# Patient Record
Sex: Female | Born: 1965 | Race: Black or African American | Hispanic: No | Smoking: Former smoker
Health system: Southern US, Community
[De-identification: ages and names within clinical notes are randomized; demographics above are authoritative.]

## PROBLEM LIST (undated history)

## (undated) DIAGNOSIS — N938 Other specified abnormal uterine and vaginal bleeding: Secondary | ICD-10-CM

## (undated) DIAGNOSIS — T7840XA Allergy, unspecified, initial encounter: Secondary | ICD-10-CM

## (undated) DIAGNOSIS — G43909 Migraine, unspecified, not intractable, without status migrainosus: Secondary | ICD-10-CM

## (undated) DIAGNOSIS — E119 Type 2 diabetes mellitus without complications: Secondary | ICD-10-CM

## (undated) DIAGNOSIS — D649 Anemia, unspecified: Secondary | ICD-10-CM

## (undated) DIAGNOSIS — I10 Essential (primary) hypertension: Secondary | ICD-10-CM

## (undated) HISTORY — DX: Allergy, unspecified, initial encounter: T78.40XA

## (undated) HISTORY — DX: Anemia, unspecified: D64.9

## (undated) HISTORY — DX: Other specified abnormal uterine and vaginal bleeding: N93.8

## (undated) HISTORY — DX: Essential (primary) hypertension: I10

## (undated) HISTORY — DX: Migraine, unspecified, not intractable, without status migrainosus: G43.909

---

## 1997-11-22 ENCOUNTER — Emergency Department (HOSPITAL_COMMUNITY): Admission: EM | Admit: 1997-11-22 | Discharge: 1997-11-22 | Payer: Self-pay | Admitting: Emergency Medicine

## 1997-11-24 ENCOUNTER — Encounter: Admission: RE | Admit: 1997-11-24 | Discharge: 1997-11-24 | Payer: Self-pay | Admitting: Family Medicine

## 1998-02-05 ENCOUNTER — Encounter: Admission: RE | Admit: 1998-02-05 | Discharge: 1998-02-05 | Payer: Self-pay | Admitting: Family Medicine

## 1998-02-11 ENCOUNTER — Encounter: Admission: RE | Admit: 1998-02-11 | Discharge: 1998-05-12 | Payer: Self-pay | Admitting: *Deleted

## 1998-02-24 ENCOUNTER — Encounter: Admission: RE | Admit: 1998-02-24 | Discharge: 1998-05-25 | Payer: Self-pay

## 1998-03-20 ENCOUNTER — Ambulatory Visit (HOSPITAL_COMMUNITY): Admission: RE | Admit: 1998-03-20 | Discharge: 1998-03-20 | Payer: Self-pay | Admitting: Orthopedic Surgery

## 1998-03-24 ENCOUNTER — Ambulatory Visit (HOSPITAL_COMMUNITY): Admission: RE | Admit: 1998-03-24 | Discharge: 1998-03-24 | Payer: Self-pay | Admitting: Orthopedic Surgery

## 1998-04-06 ENCOUNTER — Encounter: Admission: RE | Admit: 1998-04-06 | Discharge: 1998-07-05 | Payer: Self-pay | Admitting: Anesthesiology

## 1998-11-01 ENCOUNTER — Encounter: Admission: RE | Admit: 1998-11-01 | Discharge: 1998-11-01 | Payer: Self-pay | Admitting: Family Medicine

## 1998-11-09 ENCOUNTER — Emergency Department (HOSPITAL_COMMUNITY): Admission: EM | Admit: 1998-11-09 | Discharge: 1998-11-09 | Payer: Self-pay | Admitting: Emergency Medicine

## 1998-11-10 ENCOUNTER — Other Ambulatory Visit: Admission: RE | Admit: 1998-11-10 | Discharge: 1998-11-10 | Payer: Self-pay | Admitting: Family Medicine

## 1998-11-10 ENCOUNTER — Encounter: Admission: RE | Admit: 1998-11-10 | Discharge: 1998-11-10 | Payer: Self-pay | Admitting: Family Medicine

## 1998-11-16 ENCOUNTER — Encounter: Admission: RE | Admit: 1998-11-16 | Discharge: 1998-12-08 | Payer: Self-pay

## 1998-11-19 ENCOUNTER — Encounter: Payer: Self-pay | Admitting: Emergency Medicine

## 1998-11-19 ENCOUNTER — Emergency Department (HOSPITAL_COMMUNITY): Admission: EM | Admit: 1998-11-19 | Discharge: 1998-11-19 | Payer: Self-pay | Admitting: Emergency Medicine

## 1998-12-01 ENCOUNTER — Encounter: Admission: RE | Admit: 1998-12-01 | Discharge: 1998-12-01 | Payer: Self-pay | Admitting: Family Medicine

## 1998-12-09 ENCOUNTER — Encounter: Admission: RE | Admit: 1998-12-09 | Discharge: 1998-12-09 | Payer: Self-pay | Admitting: Family Medicine

## 1998-12-21 ENCOUNTER — Encounter: Admission: RE | Admit: 1998-12-21 | Discharge: 1998-12-21 | Payer: Self-pay | Admitting: Family Medicine

## 1999-06-10 ENCOUNTER — Encounter: Admission: RE | Admit: 1999-06-10 | Discharge: 1999-06-10 | Payer: Self-pay | Admitting: Sports Medicine

## 1999-07-12 ENCOUNTER — Encounter: Admission: RE | Admit: 1999-07-12 | Discharge: 1999-07-12 | Payer: Self-pay | Admitting: Family Medicine

## 1999-12-02 ENCOUNTER — Encounter: Admission: RE | Admit: 1999-12-02 | Discharge: 1999-12-02 | Payer: Self-pay | Admitting: Family Medicine

## 1999-12-06 ENCOUNTER — Encounter: Admission: RE | Admit: 1999-12-06 | Discharge: 1999-12-06 | Payer: Self-pay | Admitting: Family Medicine

## 1999-12-16 ENCOUNTER — Encounter: Admission: RE | Admit: 1999-12-16 | Discharge: 1999-12-16 | Payer: Self-pay | Admitting: Sports Medicine

## 1999-12-16 ENCOUNTER — Encounter: Payer: Self-pay | Admitting: Sports Medicine

## 1999-12-30 ENCOUNTER — Encounter: Admission: RE | Admit: 1999-12-30 | Discharge: 1999-12-30 | Payer: Self-pay | Admitting: Family Medicine

## 2000-03-20 ENCOUNTER — Encounter: Admission: RE | Admit: 2000-03-20 | Discharge: 2000-03-20 | Payer: Self-pay | Admitting: Family Medicine

## 2000-03-20 ENCOUNTER — Encounter: Payer: Self-pay | Admitting: Family Medicine

## 2000-07-31 ENCOUNTER — Encounter: Admission: RE | Admit: 2000-07-31 | Discharge: 2000-07-31 | Payer: Self-pay | Admitting: Sports Medicine

## 2000-09-04 ENCOUNTER — Encounter: Admission: RE | Admit: 2000-09-04 | Discharge: 2000-09-04 | Payer: Self-pay | Admitting: Family Medicine

## 2000-09-07 ENCOUNTER — Encounter: Admission: RE | Admit: 2000-09-07 | Discharge: 2000-09-07 | Payer: Self-pay | Admitting: Family Medicine

## 2001-04-19 ENCOUNTER — Encounter: Admission: RE | Admit: 2001-04-19 | Discharge: 2001-04-19 | Payer: Self-pay | Admitting: Family Medicine

## 2001-06-07 ENCOUNTER — Encounter: Admission: RE | Admit: 2001-06-07 | Discharge: 2001-06-07 | Payer: Self-pay | Admitting: Family Medicine

## 2001-06-14 ENCOUNTER — Encounter: Admission: RE | Admit: 2001-06-14 | Discharge: 2001-06-14 | Payer: Self-pay | Admitting: Family Medicine

## 2001-07-30 ENCOUNTER — Encounter: Admission: RE | Admit: 2001-07-30 | Discharge: 2001-07-30 | Payer: Self-pay | Admitting: Occupational Medicine

## 2001-07-30 ENCOUNTER — Encounter: Payer: Self-pay | Admitting: Occupational Medicine

## 2001-11-08 ENCOUNTER — Encounter: Admission: RE | Admit: 2001-11-08 | Discharge: 2001-11-08 | Payer: Self-pay | Admitting: Family Medicine

## 2002-11-27 ENCOUNTER — Encounter: Admission: RE | Admit: 2002-11-27 | Discharge: 2002-11-27 | Payer: Self-pay | Admitting: Sports Medicine

## 2002-11-28 ENCOUNTER — Encounter: Admission: RE | Admit: 2002-11-28 | Discharge: 2002-11-28 | Payer: Self-pay | Admitting: Sports Medicine

## 2002-11-28 ENCOUNTER — Encounter: Payer: Self-pay | Admitting: Sports Medicine

## 2002-12-04 ENCOUNTER — Encounter: Admission: RE | Admit: 2002-12-04 | Discharge: 2002-12-04 | Payer: Self-pay | Admitting: Family Medicine

## 2004-02-08 ENCOUNTER — Encounter: Admission: RE | Admit: 2004-02-08 | Discharge: 2004-02-08 | Payer: Self-pay | Admitting: Family Medicine

## 2004-03-01 ENCOUNTER — Encounter: Admission: RE | Admit: 2004-03-01 | Discharge: 2004-03-01 | Payer: Self-pay | Admitting: Family Medicine

## 2004-04-05 ENCOUNTER — Encounter: Admission: RE | Admit: 2004-04-05 | Discharge: 2004-04-05 | Payer: Self-pay | Admitting: Family Medicine

## 2004-08-12 ENCOUNTER — Ambulatory Visit: Payer: Self-pay | Admitting: Family Medicine

## 2005-04-12 ENCOUNTER — Ambulatory Visit: Payer: Self-pay | Admitting: Family Medicine

## 2005-04-14 ENCOUNTER — Encounter (INDEPENDENT_AMBULATORY_CARE_PROVIDER_SITE_OTHER): Payer: Self-pay | Admitting: *Deleted

## 2005-04-14 LAB — CONVERTED CEMR LAB

## 2005-05-05 ENCOUNTER — Ambulatory Visit: Payer: Self-pay | Admitting: Family Medicine

## 2005-06-01 ENCOUNTER — Ambulatory Visit: Payer: Self-pay | Admitting: Family Medicine

## 2005-11-17 ENCOUNTER — Encounter: Admission: RE | Admit: 2005-11-17 | Discharge: 2005-11-17 | Payer: Self-pay | Admitting: Sports Medicine

## 2006-06-19 ENCOUNTER — Ambulatory Visit (HOSPITAL_COMMUNITY): Admission: RE | Admit: 2006-06-19 | Discharge: 2006-06-19 | Payer: Self-pay | Admitting: Family Medicine

## 2006-06-19 ENCOUNTER — Ambulatory Visit: Payer: Self-pay | Admitting: Family Medicine

## 2006-10-11 DIAGNOSIS — E669 Obesity, unspecified: Secondary | ICD-10-CM

## 2006-10-11 DIAGNOSIS — I10 Essential (primary) hypertension: Secondary | ICD-10-CM

## 2006-10-12 ENCOUNTER — Encounter (INDEPENDENT_AMBULATORY_CARE_PROVIDER_SITE_OTHER): Payer: Self-pay | Admitting: *Deleted

## 2007-02-18 ENCOUNTER — Encounter (INDEPENDENT_AMBULATORY_CARE_PROVIDER_SITE_OTHER): Payer: Self-pay | Admitting: Family Medicine

## 2007-02-18 ENCOUNTER — Ambulatory Visit: Payer: Self-pay | Admitting: Sports Medicine

## 2007-02-18 LAB — CONVERTED CEMR LAB
CO2: 26 meq/L (ref 19–32)
Calcium: 9 mg/dL (ref 8.4–10.5)
Cholesterol: 161 mg/dL (ref 0–200)
HDL: 47 mg/dL (ref >39)
Sodium: 141 meq/L (ref 135–145)
Total CHOL/HDL Ratio: 3.4

## 2007-02-22 ENCOUNTER — Encounter (INDEPENDENT_AMBULATORY_CARE_PROVIDER_SITE_OTHER): Payer: Self-pay | Admitting: Family Medicine

## 2007-10-07 ENCOUNTER — Emergency Department (HOSPITAL_COMMUNITY): Admission: EM | Admit: 2007-10-07 | Discharge: 2007-10-07 | Payer: Self-pay | Admitting: Emergency Medicine

## 2007-10-09 ENCOUNTER — Ambulatory Visit: Payer: Self-pay | Admitting: Family Medicine

## 2007-10-21 ENCOUNTER — Encounter (INDEPENDENT_AMBULATORY_CARE_PROVIDER_SITE_OTHER): Payer: Self-pay | Admitting: *Deleted

## 2007-10-21 ENCOUNTER — Ambulatory Visit: Payer: Self-pay | Admitting: Family Medicine

## 2007-10-21 ENCOUNTER — Encounter (INDEPENDENT_AMBULATORY_CARE_PROVIDER_SITE_OTHER): Payer: Self-pay | Admitting: Family Medicine

## 2007-10-21 ENCOUNTER — Ambulatory Visit (HOSPITAL_COMMUNITY): Admission: RE | Admit: 2007-10-21 | Discharge: 2007-10-21 | Payer: Self-pay | Admitting: Family Medicine

## 2007-10-21 DIAGNOSIS — D509 Iron deficiency anemia, unspecified: Secondary | ICD-10-CM

## 2007-10-21 LAB — CONVERTED CEMR LAB
BUN: 9 mg/dL (ref 6–23)
Calcium: 9 mg/dL (ref 8.4–10.5)
Eosinophils Absolute: 0.1 10*3/uL (ref 0.0–0.7)
Eosinophils Relative: 1 % (ref 0–5)
Glucose, Bld: 87 mg/dL (ref 70–99)
HCT: 35.5 % — ABNORMAL LOW (ref 36.0–46.0)
Hemoglobin: 12.4 g/dL (ref 12.0–15.0)
Iron: 47 ug/dL (ref 42–145)
Lymphs Abs: 1.6 10*3/uL (ref 0.7–4.0)
MCV: 70.9 fL — ABNORMAL LOW (ref 78.0–100.0)
Monocytes Relative: 8 % (ref 3–12)
RBC: 5.01 M/uL (ref 3.87–5.11)
Total CK: 85 units/L (ref 7–177)
WBC: 4.7 10*3/uL (ref 4.0–10.5)

## 2007-10-23 ENCOUNTER — Encounter (INDEPENDENT_AMBULATORY_CARE_PROVIDER_SITE_OTHER): Payer: Self-pay | Admitting: Family Medicine

## 2007-10-24 ENCOUNTER — Encounter (INDEPENDENT_AMBULATORY_CARE_PROVIDER_SITE_OTHER): Payer: Self-pay | Admitting: Family Medicine

## 2007-10-24 ENCOUNTER — Ambulatory Visit: Payer: Self-pay | Admitting: Family Medicine

## 2007-10-24 LAB — CONVERTED CEMR LAB
Bilirubin, Direct: 0.1 mg/dL (ref 0.0–0.3)
CMV IgM: <0.9 (ref <0.90)
Heterophile Ab Screen: NEGATIVE
Hgb A: 97.3 % (ref 96.8–97.8)
Hgb S Quant: 0 % (ref 0.0–0.0)
INR: 1.1 (ref 0.0–1.5)
Indirect Bilirubin: 0.3 mg/dL (ref 0.0–0.9)
Prothrombin Time: 14.5 s (ref 11.6–15.2)
Total CK: 86 units/L (ref 7–177)
Total Protein: 6.9 g/dL (ref 6.0–8.3)

## 2007-10-28 ENCOUNTER — Encounter (HOSPITAL_COMMUNITY): Admission: RE | Admit: 2007-10-28 | Discharge: 2007-10-29 | Payer: Self-pay | Admitting: Family Medicine

## 2007-10-30 ENCOUNTER — Ambulatory Visit (HOSPITAL_COMMUNITY): Admission: RE | Admit: 2007-10-30 | Discharge: 2007-10-30 | Payer: Self-pay | Admitting: Sports Medicine

## 2007-10-30 ENCOUNTER — Encounter: Payer: Self-pay | Admitting: Sports Medicine

## 2007-10-30 ENCOUNTER — Telehealth (INDEPENDENT_AMBULATORY_CARE_PROVIDER_SITE_OTHER): Payer: Self-pay | Admitting: Family Medicine

## 2007-10-31 ENCOUNTER — Telehealth (INDEPENDENT_AMBULATORY_CARE_PROVIDER_SITE_OTHER): Payer: Self-pay | Admitting: Family Medicine

## 2007-11-07 ENCOUNTER — Ambulatory Visit: Payer: Self-pay | Admitting: Family Medicine

## 2008-04-13 ENCOUNTER — Encounter (INDEPENDENT_AMBULATORY_CARE_PROVIDER_SITE_OTHER): Payer: Self-pay | Admitting: Family Medicine

## 2008-04-13 ENCOUNTER — Ambulatory Visit: Payer: Self-pay | Admitting: Family Medicine

## 2008-04-13 ENCOUNTER — Encounter: Payer: Self-pay | Admitting: Family Medicine

## 2008-04-13 LAB — CONVERTED CEMR LAB
HCT: 31.1 % — ABNORMAL LOW (ref 36.0–46.0)
Hemoglobin: 11.3 g/dL — ABNORMAL LOW (ref 12.0–15.0)
RBC: 4.43 M/uL (ref 3.87–5.11)
RDW: 15.1 % (ref 11.5–15.5)
Saturation Ratios: 26 % (ref 20–55)

## 2008-04-17 ENCOUNTER — Encounter: Payer: Self-pay | Admitting: Family Medicine

## 2008-05-25 ENCOUNTER — Encounter: Admission: RE | Admit: 2008-05-25 | Discharge: 2008-05-25 | Payer: Self-pay | Admitting: Family Medicine

## 2008-05-28 ENCOUNTER — Emergency Department (HOSPITAL_COMMUNITY): Admission: EM | Admit: 2008-05-28 | Discharge: 2008-05-28 | Payer: Self-pay | Admitting: Family Medicine

## 2008-11-29 ENCOUNTER — Emergency Department (HOSPITAL_COMMUNITY): Admission: EM | Admit: 2008-11-29 | Discharge: 2008-11-29 | Payer: Self-pay | Admitting: Emergency Medicine

## 2008-11-30 ENCOUNTER — Ambulatory Visit (HOSPITAL_COMMUNITY): Admission: RE | Admit: 2008-11-30 | Discharge: 2008-11-30 | Payer: Self-pay | Admitting: Family Medicine

## 2008-11-30 ENCOUNTER — Encounter (INDEPENDENT_AMBULATORY_CARE_PROVIDER_SITE_OTHER): Payer: Self-pay | Admitting: Family Medicine

## 2008-11-30 ENCOUNTER — Telehealth: Payer: Self-pay | Admitting: Family Medicine

## 2008-11-30 ENCOUNTER — Ambulatory Visit: Payer: Self-pay | Admitting: Family Medicine

## 2008-11-30 DIAGNOSIS — R1013 Epigastric pain: Secondary | ICD-10-CM

## 2008-11-30 LAB — CONVERTED CEMR LAB
ALT: 15 units/L (ref 0–35)
Basophils Relative: 0 % (ref 0–1)
CO2: 27 meq/L (ref 19–32)
Calcium: 9.2 mg/dL (ref 8.4–10.5)
Chloride: 102 meq/L (ref 96–112)
Creatinine, Ser: 0.97 mg/dL (ref 0.40–1.20)
Eosinophils Absolute: 0.1 10*3/uL (ref 0.0–0.7)
Eosinophils Relative: 2 % (ref 0–5)
Glucose, Bld: 86 mg/dL (ref 70–99)
HCT: 35.7 % — ABNORMAL LOW (ref 36.0–46.0)
Hemoglobin: 12.1 g/dL (ref 12.0–15.0)
Lipase: 35 units/L (ref 11–59)
Lymphs Abs: 2.2 10*3/uL (ref 0.7–4.0)
MCHC: 33.9 g/dL (ref 30.0–36.0)
MCV: 76.6 fL — ABNORMAL LOW (ref 78.0–100.0)
Monocytes Absolute: 0.3 10*3/uL (ref 0.1–1.0)
Monocytes Relative: 7 % (ref 3–12)
Neutrophils Relative %: 48 % (ref 43–77)
RBC: 4.65 M/uL (ref 3.87–5.11)
Total Bilirubin: 0.4 mg/dL (ref 0.3–1.2)
WBC: 5.1 10*3/uL (ref 4.0–10.5)

## 2009-04-13 ENCOUNTER — Encounter: Admission: RE | Admit: 2009-04-13 | Discharge: 2009-04-13 | Payer: Self-pay | Admitting: Family Medicine

## 2009-04-13 ENCOUNTER — Encounter (INDEPENDENT_AMBULATORY_CARE_PROVIDER_SITE_OTHER): Payer: Self-pay | Admitting: Family Medicine

## 2009-04-13 ENCOUNTER — Ambulatory Visit: Payer: Self-pay | Admitting: Family Medicine

## 2009-04-13 ENCOUNTER — Encounter: Payer: Self-pay | Admitting: Family Medicine

## 2009-04-13 DIAGNOSIS — N926 Irregular menstruation, unspecified: Secondary | ICD-10-CM | POA: Insufficient documentation

## 2009-04-13 DIAGNOSIS — R29898 Other symptoms and signs involving the musculoskeletal system: Secondary | ICD-10-CM | POA: Insufficient documentation

## 2009-04-13 LAB — CONVERTED CEMR LAB
Chlamydia, DNA Probe: NEGATIVE
GC Probe Amp, Genital: NEGATIVE

## 2009-04-14 ENCOUNTER — Ambulatory Visit (HOSPITAL_COMMUNITY): Admission: RE | Admit: 2009-04-14 | Discharge: 2009-04-14 | Payer: Self-pay | Admitting: Family Medicine

## 2009-04-15 ENCOUNTER — Telehealth: Payer: Self-pay | Admitting: *Deleted

## 2009-05-11 ENCOUNTER — Telehealth: Payer: Self-pay | Admitting: Family Medicine

## 2009-10-19 ENCOUNTER — Emergency Department (HOSPITAL_COMMUNITY): Admission: EM | Admit: 2009-10-19 | Discharge: 2009-10-19 | Payer: Self-pay | Admitting: Family Medicine

## 2010-02-13 ENCOUNTER — Emergency Department (HOSPITAL_COMMUNITY): Admission: EM | Admit: 2010-02-13 | Discharge: 2010-02-13 | Payer: Self-pay | Admitting: Family Medicine

## 2010-04-28 ENCOUNTER — Telehealth: Payer: Self-pay | Admitting: Family Medicine

## 2010-06-17 ENCOUNTER — Ambulatory Visit: Payer: Self-pay | Admitting: Family Medicine

## 2010-06-17 ENCOUNTER — Encounter: Payer: Self-pay | Admitting: Family Medicine

## 2010-06-17 DIAGNOSIS — R5381 Other malaise: Secondary | ICD-10-CM | POA: Insufficient documentation

## 2010-06-17 DIAGNOSIS — IMO0002 Reserved for concepts with insufficient information to code with codable children: Secondary | ICD-10-CM | POA: Insufficient documentation

## 2010-06-17 DIAGNOSIS — R5383 Other fatigue: Secondary | ICD-10-CM

## 2010-06-17 LAB — CONVERTED CEMR LAB
HCT: 34.3 % — ABNORMAL LOW (ref 36.0–46.0)
HDL: 51 mg/dL (ref >39)
Hemoglobin: 12.3 g/dL (ref 12.0–15.0)
LDL Cholesterol: 104 mg/dL — ABNORMAL HIGH (ref 0–99)
MCHC: 35.9 g/dL (ref 30.0–36.0)
Platelets: 293 10*3/uL (ref 150–400)
RDW: 16.6 % — ABNORMAL HIGH (ref 11.5–15.5)
TSH: 2.376 microintl units/mL (ref 0.350–4.500)
Triglycerides: 58 mg/dL (ref <150)

## 2010-07-25 ENCOUNTER — Ambulatory Visit: Payer: Self-pay | Admitting: Family Medicine

## 2010-07-25 ENCOUNTER — Encounter: Payer: Self-pay | Admitting: Family Medicine

## 2010-07-25 LAB — CONVERTED CEMR LAB
Free T4: 0.78 ng/dL — ABNORMAL LOW (ref 0.80–1.80)
Iron: 60 ug/dL (ref 42–145)
MCHC: 35.8 g/dL (ref 30.0–36.0)
MCV: 69.8 fL — ABNORMAL LOW (ref 78.0–100.0)
Platelets: 262 10*3/uL (ref 150–400)
RBC: 4.64 M/uL (ref 3.87–5.11)
TIBC: 312 ug/dL (ref 250–470)
TSH: 3.224 microintl units/mL (ref 0.350–4.500)
UIBC: 252 ug/dL

## 2010-07-27 ENCOUNTER — Telehealth: Payer: Self-pay | Admitting: Family Medicine

## 2010-07-28 ENCOUNTER — Ambulatory Visit: Payer: Self-pay | Admitting: Family Medicine

## 2010-08-01 ENCOUNTER — Emergency Department (HOSPITAL_COMMUNITY)
Admission: EM | Admit: 2010-08-01 | Discharge: 2010-08-02 | Payer: Self-pay | Source: Home / Self Care | Admitting: Emergency Medicine

## 2010-08-04 ENCOUNTER — Telehealth (INDEPENDENT_AMBULATORY_CARE_PROVIDER_SITE_OTHER): Payer: Self-pay | Admitting: *Deleted

## 2010-08-04 ENCOUNTER — Ambulatory Visit: Payer: Self-pay | Admitting: Family Medicine

## 2010-08-04 ENCOUNTER — Encounter: Payer: Self-pay | Admitting: Family Medicine

## 2010-08-04 LAB — CONVERTED CEMR LAB
Pap Smear: NEGATIVE
Testosterone: 45.76 ng/dL (ref 10–70)

## 2010-08-10 ENCOUNTER — Telehealth: Payer: Self-pay | Admitting: *Deleted

## 2010-08-18 ENCOUNTER — Encounter: Payer: Self-pay | Admitting: Family Medicine

## 2010-08-18 ENCOUNTER — Ambulatory Visit: Admission: RE | Admit: 2010-08-18 | Discharge: 2010-08-18 | Payer: Self-pay | Source: Home / Self Care

## 2010-08-19 ENCOUNTER — Ambulatory Visit (HOSPITAL_COMMUNITY)
Admission: RE | Admit: 2010-08-19 | Discharge: 2010-08-19 | Payer: Self-pay | Source: Home / Self Care | Attending: Family Medicine | Admitting: Family Medicine

## 2010-08-19 ENCOUNTER — Telehealth: Payer: Self-pay | Admitting: *Deleted

## 2010-08-23 ENCOUNTER — Encounter: Payer: Self-pay | Admitting: Family Medicine

## 2010-08-23 ENCOUNTER — Encounter: Payer: Self-pay | Admitting: *Deleted

## 2010-08-23 DIAGNOSIS — N949 Unspecified condition associated with female genital organs and menstrual cycle: Secondary | ICD-10-CM | POA: Insufficient documentation

## 2010-08-23 DIAGNOSIS — D259 Leiomyoma of uterus, unspecified: Secondary | ICD-10-CM | POA: Insufficient documentation

## 2010-08-25 ENCOUNTER — Encounter: Payer: Self-pay | Admitting: *Deleted

## 2010-08-25 ENCOUNTER — Telehealth: Payer: Self-pay | Admitting: *Deleted

## 2010-08-26 ENCOUNTER — Telehealth: Payer: Self-pay | Admitting: *Deleted

## 2010-08-30 ENCOUNTER — Telehealth: Payer: Self-pay | Admitting: *Deleted

## 2010-08-31 ENCOUNTER — Telehealth: Payer: Self-pay | Admitting: Family Medicine

## 2010-09-15 NOTE — Progress Notes (Signed)
Summary: Referral  Phone Note Call from Patient Call back at [phone]   Reason for Call: Referral Summary of Call: checking status of referral for GYN Initial call taken by: Knox Royalty,  August 19, 2010 3:20 PM  Follow-up for Phone Call        told her as soon as the appt is made they will call her Follow-up by: Golden Circle RN,  August 19, 2010 3:24 PM    sent request to pcp.Loralee Pacas CMA  August 19, 2010 5:31 PM

## 2010-09-15 NOTE — Progress Notes (Signed)
Summary: medication  ---- Converted from flag ---- ---- 08/25/2010 4:25 PM, Loralee Pacas CMA wrote: Dr. Jennette Kettle  I spoke with this pt today and she told me that her periods are still heavy and because her appt with the gyn clinic is soo far away (3/1) is there anything that can be done to help her in the meantime???    ~T ------------------------------  Phone Note Outgoing Call   Call placed by: Loralee Pacas CMA,  August 31, 2010 2:59 PM Summary of Call: called pt and lvm for her to return call  Follow-up for Phone Call        called and spoke with pt and she would like for the rx to be [facility] Follow-up by: Loralee Pacas CMA,  August 31, 2010 3:22 PM     DEAR WHITE TEAM we could TRY provera 10 mg a day for ten days. I will call itin if she desires---it LOOKS like Dr Janalyn Harder tried that at end of December...so if they did and it did nothing let .Marland KitchenMarland KitchenMarland Kitchen. well let me know.  Thanks!  Denny Levy MD  August 30, 2010 4:43 PM

## 2010-09-15 NOTE — Progress Notes (Signed)
  Phone Note Call from Patient   Caller: Patient Call For: [phone] Summary of Call: Ms. Klapper is coming to the [facility] on 1/5 for Endo Bx.  Want to know what she can expect for the procedure and if she need to take anything before coming.  Call her at work at   Follow-up for Phone Call        called and explained procedure to pt she understood Follow-up by: Loralee Pacas CMA,  August 12, 2010 12:29 PM

## 2010-09-15 NOTE — Assessment & Plan Note (Signed)
Summary: f/u eo   Vital Signs:  Patient profile:   45 year old female Height:      67 inches Weight:      279.4 pounds BMI:     43.92 Temp:     98.5 degrees F oral Pulse rate:   87 / minute BP sitting:   111 / 74  (left arm) Cuff size:   large  Vitals Entered By: Garen Grams LPN (July 25, 2010 8:50 AM) CC: f/u weight Is Patient Diabetic? No Pain Assessment Patient in pain? no        Primary Care Provider:  Alvia Grove DO  CC:  f/u weight.  History of Present Illness: 45  yo female here for f/u of medical problems and to discuss weight gain (over multiple years).  My first visit with pt.  Amenorrhea:  has not had a period in over one year.  Complains of painful intercourse, started some time ago and is unchanging.  Tramadol helps.  Describes pelvic pain, worse on the left, and lasting about 3  days after intercourse.  Did have a workup for this in September 2010, ultrasound showed a 2.5  cm fibroid.  Abnormal menstral bleeding: over past year, has only had 3 menstrual cycles - once last september, then may of this year and august of this year.  1st 2 were very heavy.  in addition she has had hot flashes and vaginal dryness.  mom did go through menopause around age 92.   Menopause likely dx at this point given abscense of menstartion x 1 year.  weight:  had been going off and on to bariatric clinic, decided that she did not ultimatley want surgery, so she has abandoned the idea altogether.  feels she eats healthy in general, does exercise 5  times a week (walks). Feels she should be losing weight and is getting frustrated with her lack of improvement.   interested in medicine to aid in weight loss efforts.  had been on Adipex before and she reports good results with this medicine.  she thinks this helped curb her appetite. She had no reported side effects from this medicine.  she would like to go back on it.  she has seen nutrition a long time ago and thought this was helpful  and would consider this again.   Preventative :  would like to catch up on her preventative screens HTN:  reports good control of BP.  taking meds as prescribed.  Denies racing heart, palpitations, chest pain, SOB, DOE.    Habits & Providers  Alcohol-Tobacco-Diet     Alcohol drinks/day: 0     Tobacco Status: never     Year Quit: 2006  Allergies: No Known Drug Allergies  Physical Exam  General:  Well-developed,obese VS reviewed Lungs:  Normal respiratory effort, chest expands symmetrically. Lungs are clear to auscultation, no crackles or wheezes. Heart:  Normal rate and regular rhythm. S1 and S2 normal without gallop, murmur, click, rub or other extra sounds. Abdomen:  Bowel sounds positive,abdomen soft and non-tender without masses, organomegaly or hernias noted. Extremities:  no edema   Impression & Recommendations:  Problem # 1:  OBESITY, NOS (ICD-278.00) counseled >30 minutes see pt instructions Orders: [facility]- Est  Level 4 (16109)  Problem # 2:  IRREGULAR MENSES (ICD-626.4) see instructions Orders: [facility]- Est  Level 4 (60454)  Problem # 3:  ANEMIA, IRON DEFICIENCY, CHRONIC (ICD-280.9)  Her updated medication list for this problem includes:    Ferrous Sulfate 325 (  65 Fe) Mg Tabs (Ferrous sulfate) .Marland Kitchen... 1 by mouth three times a day  Orders: CBC-[facility] (69629) Iron -[facility] [id number]) Iron Binding Cap (TIBC)-[facility] ([address]) Ferritin-[facility] [phone]) [facility]- Est  Level 4 (56387)  Problem # 4:  FATIGUE (ICD-780.79)  Orders: TSH-[facility] (56433-29518) Free T4-[facility] (84166-06301) [facility]- Est  Level 4 (60109)  Complete Medication List: 1)  Aspir-81 81 Mg Tbec (Aspirin) .... Take 1 tablet by mouth every morning 2)  Nexium 40 Mg Cpdr (Esomeprazole magnesium) .... Take 1 capsule by mouth once a day 3)  Zestoretic 20-25 Mg Tabs (Lisinopril-hydrochlorothiazide) .... Take 1 tablet by mouth once a day 4)  Ferrous Sulfate 325 (65 Fe) Mg Tabs (Ferrous sulfate) .Marland Kitchen.. 1 by mouth three times a  day 5)  Tramadol Hcl 50 Mg Tabs (Tramadol hcl) .... 1/2 to 1 tablet q 6 hours as needed pain 6)  Phentermine Hcl 37.5 Mg Tabs (Phentermine hcl) .... Take 1/2 pill by mouth two times a day  Patient Instructions: 1)  Great job with the weight loss!  Keep up the good work! 2)  Make sure ypu exercise and watch your diet.  3)   I will give you a medicine to aid in weight loss, but remember that the pill is not a magic answer and you must exercise and watch your diet to optimize weight loss. 4)   Take the phentermine 1/2 pill by mouth two times a day  5)  Make sure you eat at least 5 meals a day, light meals. 6)  Please have your vitals checked every 2 weeks at your work and fax them to me. 7)  I will call you if your labs come back abnormal. 8)   It was great as always to see you today! Prescriptions: PHENTERMINE HCL 37.5 MG TABS (PHENTERMINE HCL) take 1/2 pill by mouth two times a day  #0 x 0   Entered and Authorized by:   Alvia Grove DO   Signed by:   Alvia Grove DO on 07/25/2010   Method used:   Print then Give to Patient   RxID:   [id number] PHENTERMINE HCL 37.5 MG TABS (PHENTERMINE HCL) take 1/2 pill by mouth two times a day, do not take the second pill any later in the day than 3pm  #60 x 0   Entered and Authorized by:   Alvia Grove DO   Signed by:   Alvia Grove DO on 07/25/2010   Method used:   Print then Give to Patient   RxID:   [id number]    Orders Added: 1)  CBC-[facility] [85027] 2)  Iron -[facility] [73710-62694] 3)  Iron Binding Cap (TIBC)-[facility] [[address]] 4)  Ferritin-[facility] [82728-23350] 5)  TSH-[facility] [00938-18299] 6)  Free T4-[facility] [37169-67893] 7)  [facility]- Est  Level 4 [81017]  Appended Document: f/u eo pharmacy calls because the phentermine Rx they received had no quanity on it .  it is noted in chart that Dr. Gomez Cleverly printed and gave  rx twice and the  second has a quanity with directions. consulted Dr. Sheffield Slider and he advises can give these directions and quanity to  pharmacy but with only # 30 tabs. patient should follow up with Dr. Gomez Cleverly in one month . patient notified to schedule appointment.

## 2010-09-15 NOTE — Miscellaneous (Signed)
Summary: REFERRAL  Clinical Lists Changes  Problems: Added new problem of MENORRHAGIA, PERIMENOPAUSAL (ICD-626.8) - Signed Added new problem of FIBROIDS, UTERUS (ICD-218.9) - Signed Orders: Added new Referral order of Gynecologic Referral (Gyn) - Signed  will work on [employer] CMA  August 23, 2010 5:18 PM     Complete Medication List: 1)  Aspir-81 81 Mg Tbec (Aspirin) .... Take 1 tablet by mouth every morning 2)  Nexium 40 Mg Cpdr (Esomeprazole magnesium) .... Take 1 capsule by mouth once a day 3)  Zestoretic 20-25 Mg Tabs (Lisinopril-hydrochlorothiazide) .... Take 1 tablet by mouth once a day 4)  Ferrous Sulfate 325 (65 Fe) Mg Tabs (Ferrous sulfate) .Marland Kitchen.. 1 by mouth three times a day 5)  Tramadol Hcl 50 Mg Tabs (Tramadol hcl) .... 1/2 to 1 tablet q 6 hours as needed pain 6)  Phentermine Hcl 37.5 Mg Tabs (Phentermine hcl) .... Take 1/2 pill by mouth two times a day 7)  Medroxyprogesterone Acetate 10 Mg Tabs (Medroxyprogesterone acetate) .... 2 tabs by mouth daily x 10 days 8)  Promethazine Hcl 12.5 Mg Tabs (Promethazine hcl) .Marland Kitchen.. 1 tab by mouth every 8 hrs as needed nausea

## 2010-09-15 NOTE — Assessment & Plan Note (Signed)
Summary: cpe/pap,df   Vital Signs:  Patient profile:   45 year old female Height:      67 inches Weight:      292.25 pounds BMI:     45.94 BSA:     2.38 Temp:     98.4 degrees F Pulse rate:   76 / minute BP sitting:   112 / 83  Vitals Entered By: Jone Baseman CMA (June 17, 2010 8:41 AM) CC: f/ Is Patient Diabetic? No Pain Assessment Patient in pain? yes     Location: head Intensity: 5   Primary Care Provider:  Alvia Grove DO  CC:  f/.  History of Present Illness: 45  yo female here for f/u of medical problems and to discuss weight gain (over multiple years).  My first visit with pt.  Amenorrhea:  has not had a period in over one year.  Complains of painful intercourse, started some time ago and is unchanging.  Tramadol helps.  Describes pelvic pain, worse on the left, and lasting about 3  days after intercourse.  Did have a workup for this in September 2010, ultrasound showed a 2.5  cm fibroid.  Abnormal menstral bleeding: over past year, has only had 3 menstrual cycles - once last september, then may of this year and august of this year.  1st 2 were very heavy.  in addition she has had hot flashes and vaginal dryness.  mom did go through menopause around age 85.   Menopause likely dx at this point given abscense of menstartion x 1 year.  weight:  had been going off and on to bariatric clinic, decided that she did not ultimatley want surgery, so she has abandoned the idea altogether.  feels she eats healthy in general, does exercise 5  times a week (walks). Feels she should be losing weight and is getting frustrated with her lack of improvement.   interested in medicine to aid in weight loss efforts.  had been on Adipex before and she reports good results with this medicine.  she thinks this helped curb her appetite. She had no reported side effects from this medicine.  she would like to go back on it.  she has seen nutrition a long time ago and thought this was helpful  and would consider this again.   Preventative :  would like to catch up on her preventative screens HTN:  reports good control of BP.  taking meds as prescribed.  Denies racing heart, palpitations, chest pain, SOB, DOE.    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 Take 1 Tablet By Mouth Every Morning 2)  Nexium 40 Mg Cpdr (Esomeprazole Magnesium) .... Take 1 Capsule By Mouth Once A Day 3)  Zestoretic 20-25 Mg Tabs (Lisinopril-Hydrochlorothiazide) .... Take 1 Tablet By Mouth Once A Day 4)  Ferrous Sulfate 325 (65 Fe) Mg  Tabs (Ferrous Sulfate) .Marland Kitchen.. 1 By Mouth Three Times A  Day 5)  Tramadol Hcl 50 Mg  Tabs (Tramadol Hcl) .... 1/2 To 1 Tablet Q 6 Hours As Needed Pain 6)  Ambien 5 Mg Tabs (Zolpidem Tartrate) .... Take 1 Pill By Mouth At Bedtime 7)  Phentermine Hcl 37.5 Mg Tabs (Phentermine Hcl) .... Take 1/2 Pill By Mouth Two Times A Day, Do Not Take The Second Pill Any Later in The Day Than 3pm  Allergies (verified): No Known Drug Allergies  Past History:  Past Medical History: Last updated: 02/18/2007 Chest Pain,  indigestion HTN  Past Surgical History: Last updated: 04/13/2008 Caesarian Sections x 3 - 03/02/2004, TSH - 1.865 (6/05) - 05/05/2005 BTL with last Csection  Family History: Last updated: 04/13/2009 CAD, DM, HTN - mom at 22, dad early 30s  Social History: Last updated: 04/13/2009 lives with 1 son Brinae Woods), her father whom she cares for.  had another son Maresa Morash) and daughter Therisa Doyne).  no longer smoking as of 03/2009.  no ETOH or Drugs. exercise: what she can with her knee pain.   +sexually active - 3 lifetime partners, last one for 4 years.  uses condoms every time.  menarch 55 yo.  mom through menopause at 62.  periods irregular.   currently in school studying psychology.  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 Lungs are clear to auscultation, no crackles or wheezes. Heart:  Normal rate and regular rhythm. S1 and S2 normal without gallop, murmur, click, rub or other extra sounds. Abdomen:  Bowel sounds positive,abdomen soft and non-tender without masses, organomegaly or hernias noted. Extremities:  no edema Neurologic:  alert & oriented X3 and cranial nerves II-XII intact.   Skin:  Intact without suspicious lesions or rashes Psych:  Oriented X3, memory intact for recent and remote, normally interactive, and good eye contact.     Impression & Recommendations:  Problem # 1:  OBESITY, NOS (ICD-278.00) >30  minutes spent counseling pt on diet, exercise, and the risks and benefits of the Adipex. Will do trial of adipex, with weekly monitoring of vitals. Pt is to RTC in no more than 6  weeks for an OV with me.  If she does not, i clearly told her I would not refill this medicine for her.  Stressed the importance of monitoring BP while taking this medicine as well as HR.  If her vitals change (she comes hypertensive or has a signifigantly elevated pulse), I will stop the medicine. see pt instructions  Problem # 2:  SCREENING FOR LIPOID DISORDERS (ICD-V77.91) FLP today for check LDL Orders: [facility]- Est  Level 4 (40981)  Problem # 3:   HYPERTENSION, BENIGN SYSTEMIC (ICD-401.1) BP is good today.  Pt denies any acute changes in vision, BP, HR.   Continue to monitor BP with the new medication addition. Her updated medication list for this problem includes:    Zestoretic 20-25 Mg Tabs (Lisinopril-hydrochlorothiazide) .Marland Kitchen... Take 1 tablet by mouth once a day  Problem # 4:  DYSPAREUNIA (ICD-625.0) secondary dyspareunia.  Started just a few years ago. Reviewed ultrasounds from 09//2011  and the only signifigant finding was a 2.5  cm fibroid.  Endometrial thickness was normal. Pt is not willing to consider surgery.  Discussed possability of starting estrogen cream vaginally, reviewed risks and benefits with pt.  She opted to  hold off on treatment right now.  Encouraged her to think about it for possible future use. Tramadol helps well for her pain.  Complete Medication List: 1)  Aspir-81 81 Mg Tbec (Aspirin) .... Take 1 tablet by mouth every morning 2)  Nexium 40 Mg Cpdr (Esomeprazole magnesium) .... Take 1 capsule by mouth once a day 3)  Zestoretic 20-25 Mg Tabs (Lisinopril-hydrochlorothiazide) .... Take 1 tablet by mouth once a day 4)  Ferrous Sulfate 325 (65 Fe) Mg Tabs (Ferrous sulfate) .Marland Kitchen.. 1 by mouth three times a day 5)  Tramadol Hcl 50 Mg Tabs (Tramadol hcl) .... 1/2 to 1 tablet q 6 hours as needed pain 6)  Ambien 5 Mg Tabs (Zolpidem tartrate) .... Take 1 pill by mouth at bedtime 7)  Phentermine Hcl 37.5 Mg Tabs (Phentermine hcl) .... Take 1/2 pill by mouth two times a day, do not take the second pill any later in the day than 3pm  Other Orders: Mammogram (Screening) (Mammo) T-Lipid Profile (16109-60454) TSH-[facility] (09811-91478) CBC-[facility] (29562)   Prevention & Chronic Care Immunizations   Influenza vaccine: Not documented    Tetanus booster: 12/13/2003: Done.   Tetanus booster due: 12/12/2013    Pneumococcal vaccine: Not documented  Other Screening   Pap smear: Normal  (04/15/2009)   Pap smear due: 04/2012     Mammogram: Normal  (05/27/2008)   Mammogram action/deferral: Ordered  (06/17/2010)   Mammogram due: 05/27/2010   Smoking status: never  (06/17/2010)  Lipids   Total Cholesterol: 161  (02/18/2007)   Lipid panel action/deferral: Lipid Panel ordered   LDL: 101  (02/18/2007)   LDL Direct: Not documented   HDL: 47  (02/18/2007)   Triglycerides: 63  (02/18/2007)  Hypertension   Last Blood Pressure: 112 / 83  (06/17/2010)   Serum creatinine: 0.97  (11/30/2008)   Serum potassium 3.6  (11/30/2008)    Hypertension flowsheet reviewed?: Yes   Progress toward BP goal: At goal  Self-Management Support :    Hypertension self-management support: Not documented   Nursing Instructions: Schedule screening mammogram (see order)   Patient Instructions: 1)  Make sure ypu exercise and watch your diet.  2)  I will give you a medicine to aid in weight loss, but remember that the pill is not a magic answer and you must exercise and watch your diet to optimize weight loss. 3)  Take the phentermine 1/2 pill by mouth two times a day  4)  Make sure you eat at least 5 meals a day, light meals. 5)  Please have your vitals checked every week at your work and fax them to me weekly. 6)  Take the ambiem at night for sleep if you need it.  7)  Plese make a follow up appoitnment in 6 weeks. 8)  Wear your pedometer and track your steps daily.  Goal is 10,000 a day.  2,00 = 1 mile. 9)  It was great as always to see you today! Prescriptions: PHENTERMINE HCL 37.5 MG TABS (PHENTERMINE HCL) take 1/2 pill by mouth two times a day, do not take the second pill any later in the day than 3pm  #60 x 0   Entered and Authorized by:   Alvia Grove DO   Signed by:   Alvia Grove DO on 06/23/2010   Method used:   Handwritten   RxID:   [id number] AMBIEN 5 MG TABS (ZOLPIDEM TARTRATE) take 1 pill by mouth at bedtime  #30 x 5   Entered and Authorized by:  Alvia Grove DO   Signed by:   Alvia Grove DO on  06/17/2010   Method used:   Print then Give to Patient   RxID:   [id number] FERROUS SULFATE 325 (65 FE) MG  TABS (FERROUS SULFATE) 1 by mouth three times a day  #90 x 3   Entered and Authorized by:   Alvia Grove DO   Signed by:   Alvia Grove DO on 06/17/2010   Method used:   Electronically to        [facility]* (retail)       [address]       [address] Shipping/mailing       [place], [place]  [address]       Ph: [phone]       Fax: [phone]   RxID:   [id number] TRAMADOL HCL 50 MG  TABS (TRAMADOL HCL) 1/2 to 1 tablet q 6 hours as needed pain  #90 x 2   Entered and Authorized by:   Alvia Grove DO   Signed by:   Alvia Grove DO on 06/17/2010   Method used:   Electronically to        [facility]* (retail)       [address]       [address] Shipping[address]mailing       [place], [place]  [address]       Ph: [phone]       Fax: [phone]   RxID:   [id number] ZESTORETIC 20-25 MG TABS (LISINOPRIL-HYDROCHLOROTHIAZIDE) Take 1 tablet by mouth once a day  #30 x 0   Entered and Authorized by:   Alvia Grove DO   Signed by:   Alvia Grove DO on 06/17/2010   Method used:   Electronically to        [facility]* (retail)       [address]       [address] Shipping[address]mail[address]       [place], [place]  [address]       Ph: [phone]       Fax: [phone]   RxID:   [id number] NEXIUM 40 MG CPDR (ESOMEPRAZOLE MAGNESIUM) Take 1 capsule by mouth once a day  #30 x 00   Entered and Authorized by:   Alvia Grove DO   Signed by:   Alvia Grove DO on 06/17/2010   Method used:   Electronically to        [facility]* (retail)       [address]       [address] Shipping[address]mailing       [place], [place]  [address]       Ph: [phone]       Fax: [phone]   RxID:   [id number]    Orders Added: 1)  Mammogram (Screening) [Mammo] 2)  T-Lipid Profile  [80061-22930] 3)  TSH-[facility] [44315-40086] 4)  [facility]- Est  Level 4 [76195] 5)  CBC-[facility] [09326]

## 2010-09-15 NOTE — Miscellaneous (Signed)
Summary: Procedures Consent  Procedures Consent   Imported By: De Nurse 08/29/2010 14:09:39  _____________________________________________________________________  External Attachment:    Type:   Image     Comment:   External Document

## 2010-09-15 NOTE — Progress Notes (Signed)
Summary: Req Rx  Phone Note Call from Patient Call back at Home Phone [phone] Call back at Work Phone [phone]   Summary of Call: cycle is still heavy, clotting & feeling weak. wants to know if MD can prescribe something to stop the bleeding. pt goes to [facility]. Initial call taken by: Knox Royalty,  July 27, 2010 8:59 AM  Follow-up for Phone Call        patient states she has been bleeding now for for 11 days. now she is passing more clots. she is feeling weak. will try to contact Dr. Hulen Luster in Dr. Cyndra Numbers absence.  Follow-up by: Theresia Lo RN,  July 28, 2010 10:13 AM  Additional Follow-up for Phone Call Additional follow up Details #1::        spoke with Larita Fife.  Pt to come in to be evaluated Additional Follow-up by: Ellery Plunk MD,  July 28, 2010 10:17 AM    Additional Follow-up for Phone Call Additional follow up Details #2::    appointment scheduled today. Follow-up by: Theresia Lo RN,  July 28, 2010 10:20 AM

## 2010-09-15 NOTE — Assessment & Plan Note (Signed)
Summary: continued vaginal bleeding/ls   Vital Signs:  Patient profile:   45 year old female Height:      67 inches Weight:      282 pounds BMI:     44.33 Temp:     98.5 degrees F oral Pulse rate:   83 / minute BP sitting:   108 / 78  (left arm) Cuff size:   large  Vitals Entered By: Tessie Fass CMA (August 04, 2010 4:32 PM) CC: irregular menses Pain Assessment Patient in pain? yes     Location: abdomen Intensity: 6   Primary Care Provider:  Alvia Grove DO  CC:  irregular menses.  History of Present Illness: 45 y/o F with HTN, anemia, obesity returns for irregular menses.  She has had intermitten, heaving periods for 2 yrs, with current cycle presnt x 3 wks.   From 2010 she would have a cycle every 3 months.  At that time she was told that she was perimenopausal when she continues to have heavy bleeding and was given Provera, which stopped her bleeding.  After the Provera her cycle has been irregular.  The last time she had a menstrual cycle this year was April.   Currently she is having a menstrual cycle x 3 weeks (started 07/17/10).  She endorses multiple dark clots.  She was seen by Dr Lelon Perla on 07/28/10 who prescribe Provera 10mg  x 10 days.  She went to ER on Monday for abd pain and work up included GC/Chlam negative, UPT negative, Wet prep +clue cells,  UA negative, Hb 11.  She was d/c from ED and told to make appt with GYN.  She has appt with Dr Nile Dear 08/23/10.  Pt came back here today because she is still bleeding.    Allergies: No Known Drug Allergies  Review of Systems General:  Denies chills and fever. GU:  Complains of abnormal vaginal bleeding; denies discharge, dysuria, and hematuria.  Physical Exam  General:  Well-developed,well-nourished,in no mild distress; alert,appropriate and cooperative throughout examination Genitalia:  Normal introitus for age, no external lesions, no vaginal discharge, mucosa pink and moist, no vaginal or cervical lesions,  no vaginal atrophy, no friaility, normal uterus size and position, no adnexal masses or tenderness. +blood in vaginal vault.    Impression & Recommendations:  Problem # 1:  IRREGULAR MENSES (ICD-626.4) Assessment Deteriorated 45 y/o F with irregular menses x 2 yrs.  She has been bleeding x 3 wks but the last time she had a menstrual cycle this year was 11/2009.  Last pap 04/2009 negative.  Pap performed in clinic today.  Vaginal exam did not show discharge but there was mild bleeding.  Pt may be perimmenopausal but given age (46) and obesity we should rule out endometrial cancer.  Will give another course of provera but increase dose from 10mg  to 20mg  daily x 10.  Will give phenergan as needed for nausea.  Pt to come to GYN clinic with Dr Jennette Kettle for Endometrial bx (appt 08/18/10).  Will get labs (TSH, LH, FSH, Testosterone, Prolactin) to assess stage of her cycle, although normal labs would not preclude biopsy.  I've explained this to pt today and she is in agreement with the plan.   ***Note: Location manager) showed clue cells were seen on wet prep, but pt asymptomatic and there was no vaginal discharge on my exam.  Will not treat at this time.    Her updated medication list for this problem includes:    Medroxyprogesterone Acetate 10 Mg Tabs (  Medroxyprogesterone acetate) .Marland Kitchen... 2 tabs by mouth daily x 10 days  Orders: Prolactin-[facility] ([id number]) TSH-[facility] [phone]) LH-[facility] [phone]) FSH-[facility] [phone]) Testosterone-[facility] [id number]) [facility]- Est Level  3 ([address])  Complete Medication List: 1)  Aspir-81 81 Mg Tbec (Aspirin) .... Take 1 tablet by mouth every morning 2)  Nexium 40 Mg Cpdr (Esomeprazole magnesium) .... Take 1 capsule by mouth once a day 3)  Zestoretic 20-25 Mg Tabs (Lisinopril-hydrochlorothiazide) .... Take 1 tablet by mouth once a day 4)  Ferrous Sulfate 325 (65 Fe) Mg Tabs (Ferrous sulfate) .Marland Kitchen.. 1 by mouth three times a day 5)  Tramadol Hcl 50 Mg Tabs (Tramadol hcl) .... 1/2 to 1  tablet q 6 hours as needed pain 6)  Phentermine Hcl 37.5 Mg Tabs (Phentermine hcl) .... Take 1/2 pill by mouth two times a day 7)  Medroxyprogesterone Acetate 10 Mg Tabs (Medroxyprogesterone acetate) .... 2 tabs by mouth daily x 10 days 8)  Promethazine Hcl 12.5 Mg Tabs (Promethazine hcl) .Marland Kitchen.. 1 tab by mouth every 8 hrs as needed nausea  Other Orders: Pap Smear-[facility] ([id number])  Patient Instructions: 1)  Please schedule a follow-up appointment with GYN clinic with Dr Jennette Kettle for Endometrial biopsy. 2)  Take Provera 10mg  2 tabs daily starting today to stop your bleeding.  We need to get you back in the clinic as soon as possible for the biopsy.  3)  You may have nausea with taking 2 tabs of Provera so I've also sent a script for phenergan, an antinausea medicine.   Prescriptions: PROMETHAZINE HCL 12.5 MG TABS (PROMETHAZINE HCL) 1 tab by mouth every 8 hrs as needed nausea  #30 x 1   Entered and Authorized by:   Angeline Slim MD   Signed by:   Angeline Slim MD on 08/04/2010   Method used:   Electronically to        [facility]* (retail)       [address]       [address] Shipping/mailing       [place], [place]  [address]       Ph: [phone]       Fax: [phone]   RxID:   [id number] MEDROXYPROGESTERONE ACETATE 10 MG TABS (MEDROXYPROGESTERONE ACETATE) 2 tabs by mouth daily x 10 days  #20 x 0   Entered and Authorized by:   Angeline Slim MD   Signed by:   Angeline Slim MD on 08/04/2010   Method used:   Electronically to        [facility]* (retail)       [address]       [address] Shipping/mailing       [place], [place]  [address]       Ph: [phone]       Fax: [phone]   RxID:   [id number]    Orders Added: 1)  Pap Smear-[facility] [[id number]] 2)  Prolactin-[facility] [[id number]] 3)  TSH-[facility] [[id number]] 4)  LH-[facility] [[id number]] 5)  FSH-[facility] [[id number]] 6)  Testosterone-[facility] [[id number]] 7)  [facility]- Est Level  3 [[address]]     Prevention & Chronic  Care Immunizations   Influenza vaccine: Not documented    Tetanus booster: 12/13/2003: Done.   Tetanus booster due: 12/12/2013    Pneumococcal vaccine: Not documented  Other Screening   Pap smear: Normal  (04/15/2009)   Pap smear due: 04/2012    Mammogram: Normal  (05/27/2008)   Mammogram action/deferral: Ordered  (06/17/2010)   Mammogram due: 05/27/2010  Smoking status: never  (07/28/2010)  Lipids   Total Cholesterol: 167  (06/17/2010)   Lipid panel action/deferral: Lipid Panel ordered   LDL: 104  (06/17/2010)   LDL Direct: Not documented   HDL: 51  (06/17/2010)   Triglycerides: 58  (06/17/2010)  Hypertension   Last Blood Pressure: 108 / 78  (08/04/2010)   Serum creatinine: 0.97  (11/30/2008)   Serum potassium 3.6  (11/30/2008)  Self-Management Support :    Hypertension self-management support: Not documented

## 2010-09-15 NOTE — Letter (Signed)
Summary: ENDO BX Letter  [facility]  [address]   [place], [place] [address]   Phone: [phone]  Fax: [phone]    08/23/2010  Tara Campos [address] [place], [place]  [address]  Dear Ms. Linhares,  The endometrial biopsy was normal. You should be hearing from [place] regarding the GYN appointment in next few days          Sincerely,   Denny Levy MD  Appended Document: ENDO BX Letter mailed

## 2010-09-15 NOTE — Progress Notes (Signed)
Summary: Phn Msg  Phone Note Call from Patient Call back at Home Phone [phone]   Reason for Call: Talk to Nurse Summary of Call: pt had biopsy last week, then her menstral cycle came on sun 08/21/10, wants to know if she can wear a tampon? Initial call taken by: Knox Royalty,  August 25, 2010 11:37 AM  Follow-up for Phone Call        spoke with pt concerning tampon use told her it was ok.   Follow-up by: Loralee Pacas CMA,  August 25, 2010 4:20 PM

## 2010-09-15 NOTE — Assessment & Plan Note (Signed)
Summary: adnormal bleeding/ls   Vital Signs:  Patient profile:   45 year old female Height:      67 inches Weight:      281.1 pounds BMI:     44.19 Temp:     98.4 degrees F oral Pulse rate:   87 / minute BP sitting:   99 / 66  (right arm) Cuff size:   large  Vitals Entered By: Garen Grams LPN (July 28, 2010 11:25 AM) CC: heavy vaginal bleeding and clotting x 11 days Is Patient Diabetic? No Pain Assessment Patient in pain? no        Primary Care Provider:  Alvia Grove DO  CC:  heavy vaginal bleeding and clotting x 11 days.  History of Present Illness: 1. Heavy vaginal bleeding:  Pt has been dealilng with this for the past couple of years.  She goes multiple months without a period and then has heavy, prolonged bleeding.  Most recently she has had heavy bleeding, with clots for the past 11 days.  She was seen in clinic a couple of days ago and was told something was going to be called in for her but nothing was called in.  She is still having the heavy bleeding with the clotting.  She also has a hx of iron deficiency anemia and has required IV iron in the past.   ROS: endorses fatigue and feeling run down.  denies vaginal discharge.  endorses mild abdominal cramping.  Habits & Providers  Alcohol-Tobacco-Diet     Alcohol drinks/day: 0     Tobacco Status: never     Year Quit: 2006  Current Medications (verified): 1)  Aspir-81 81 Mg Tbec (Aspirin) .... Take 1 Tablet By Mouth Every Morning 2)  Nexium 40 Mg Cpdr (Esomeprazole Magnesium) .... Take 1 Capsule By Mouth Once A Day 3)  Zestoretic 20-25 Mg Tabs (Lisinopril-Hydrochlorothiazide) .... Take 1 Tablet By Mouth Once A Day 4)  Ferrous Sulfate 325 (65 Fe) Mg  Tabs (Ferrous Sulfate) .Marland Kitchen.. 1 By Mouth Three Times A Day 5)  Tramadol Hcl 50 Mg  Tabs (Tramadol Hcl) .... 1/2 To 1 Tablet Q 6 Hours As Needed Pain 6)  Phentermine Hcl 37.5 Mg Tabs (Phentermine Hcl) .... Take 1/2 Pill By Mouth Two Times A Day 7)   Medroxyprogesterone Acetate 10 Mg Tabs (Medroxyprogesterone Acetate) .Marland Kitchen.. 1 Tab By Mouth Daily For 14 Days  Allergies: No Known Drug Allergies  Past History:  Past Medical History: Reviewed history from 02/18/2007 and no changes required. Chest Pain,  indigestion HTN  Physical Exam  General:  Well-developed,obese VS reviewed, slightly hypotensive. Eyes:  vision grossly intact.  No lid pallor Mouth:  MMM Neck:  supple, full ROM, and no masses.   Lungs:  Normal respiratory effort, chest expands symmetrically. Lungs are clear to auscultation, no crackles or wheezes. Heart:  Normal rate and regular rhythm. S1 and S2 normal without gallop, murmur, click, rub or other extra sounds. Abdomen:  Bowel sounds positive,abdomen soft and non-tender without masses. Extremities:  no edema Neurologic:  alert & oriented X3 and grossly intact.  normal gait. Skin:  turgor normal and color normal.   Psych:  not anxious appearing and not depressed appearing.   Additional Exam:  1. Hgb 11.6   Impression & Recommendations:  Problem # 1:  IRREGULAR MENSES (ICD-626.4) Assessment Deteriorated  She continues to bleed.  She likely has unopposed estrogen given her body habitus and will need progesterone to help her stop the bleeding.  She would  likely benefit from cyclical progesterone at the beginning of every month to help her bleed.  She could also be perimenopausal at this point but with heavy bleeding she should have an endometrial biopsy.  Will defer long term management to PCP. Her updated medication list for this problem includes:    Medroxyprogesterone Acetate 10 Mg Tabs (Medroxyprogesterone acetate) .Marland Kitchen... 1 tab by mouth daily for 14 days  Orders: [facility]- Est Level  3 ([address])  Complete Medication List: 1)  Aspir-81 81 Mg Tbec (Aspirin) .... Take 1 tablet by mouth every morning 2)  Nexium 40 Mg Cpdr (Esomeprazole magnesium) .... Take 1 capsule by mouth once a day 3)  Zestoretic 20-25 Mg Tabs  (Lisinopril-hydrochlorothiazide) .... Take 1 tablet by mouth once a day 4)  Ferrous Sulfate 325 (65 Fe) Mg Tabs (Ferrous sulfate) .Marland Kitchen.. 1 by mouth three times a day 5)  Tramadol Hcl 50 Mg Tabs (Tramadol hcl) .... 1/2 to 1 tablet q 6 hours as needed pain 6)  Phentermine Hcl 37.5 Mg Tabs (Phentermine hcl) .... Take 1/2 pill by mouth two times a day 7)  Medroxyprogesterone Acetate 10 Mg Tabs (Medroxyprogesterone acetate) .Marland Kitchen.. 1 tab by mouth daily for 14 days  Patient Instructions: 1)  I am going to give you a prescription to help stop the bleeding 2)  You should be stopped bleeding by Monday.  If you continue to bleed please return to clinic 3)  I think that you probably need to be on Progesterone every month but will leave that up to your regular doctor 4)  If you start feeling worse, have more bleeding, get light-headed or dizzy then please return to clinic right away or go to the ED Prescriptions: MEDROXYPROGESTERONE ACETATE 10 MG TABS (MEDROXYPROGESTERONE ACETATE) 1 tab by mouth daily for 14 days  #14 x 0   Entered and Authorized by:   Angelena Sole MD   Signed by:   Angelena Sole MD on 07/28/2010   Method used:   Print then Give to Patient   RxID:   [id number]    Orders Added: 1)  [facility]- Est Level  3 [[address]21]

## 2010-09-15 NOTE — Assessment & Plan Note (Signed)
Summary: endo biopsy/kh   Vital Signs:  Patient profile:   45 year old female Weight:      282 pounds Temp:     99.5 degrees F oral Pulse rate:   81 / minute Pulse rhythm:   regular BP sitting:   105 / 72  (left arm) Cuff size:   large  Vitals Entered By: Loralee Pacas CMA (August 18, 2010 9:44 AM) CC: endometrial bx Is Patient Diabetic? No Pain Assessment Patient in pain? no        CC:  endometrial bx.  Habits & Providers  Alcohol-Tobacco-Diet     Alcohol drinks/day: 0     Tobacco Status: never     Year Quit: 2006  Exercise-Depression-Behavior     Have you felt down or hopeless? no     Have you felt little pleasure in things? no     Depression Counseling: not indicated; screening negative for depression     Seat Belt Use: always  Current Medications (verified): 1)  Aspir-81 81 Mg Tbec (Aspirin) .... Take 1 Tablet By Mouth Every Morning 2)  Nexium 40 Mg Cpdr (Esomeprazole Magnesium) .... Take 1 Capsule By Mouth Once A Day 3)  Zestoretic 20-25 Mg Tabs (Lisinopril-Hydrochlorothiazide) .... Take 1 Tablet By Mouth Once A Day 4)  Ferrous Sulfate 325 (65 Fe) Mg  Tabs (Ferrous Sulfate) .Marland Kitchen.. 1 By Mouth Three Times A Day 5)  Tramadol Hcl 50 Mg  Tabs (Tramadol Hcl) .... 1/2 To 1 Tablet Q 6 Hours As Needed Pain 6)  Phentermine Hcl 37.5 Mg Tabs (Phentermine Hcl) .... Take 1/2 Pill By Mouth Two Times A Day 7)  Medroxyprogesterone Acetate 10 Mg Tabs (Medroxyprogesterone Acetate) .... 2 Tabs By Mouth Daily X 10 Days 8)  Promethazine Hcl 12.5 Mg Tabs (Promethazine Hcl) .Marland Kitchen.. 1 Tab By Mouth Every 8 Hrs As Needed Nausea  Allergies (verified): No Known Drug Allergies  Physical Exam  Additional Exam:  Patient given informed consent, signed copy in the chart. Placed in lithotomy position. Cervix viewed with sterile speculum. The portio of the cervix was cleansed with individual betadine swabs. The cervix was secured with a tenaculum placed in the anterior lip. The uterus was measured  using sound. Endometrial sample was taken using GynoSampler pipelle. All equipment was removed. The patient tolerated the procedure well and was given post procedure instructions. we will contact her with pathology result    Impression & Recommendations:  Problem # 1:  IRREGULAR MENSES (ICD-626.4)  Her updated medication list for this problem includes:    Medroxyprogesterone Acetate 10 Mg Tabs (Medroxyprogesterone acetate) .Marland Kitchen... 2 tabs by mouth daily x 10 days  Orders: Ultrasound (Ultrasound) [facility]- Est Level  3 ([address]) Endometrial/Endocerv BX- [facility] ([address]) perimenopausal menoprrhagia endo bx done will get PUS and then likely set up at [facility] for eval andometrail  ablation  Complete Medication List: 1)  Aspir-81 81 Mg Tbec (Aspirin) .... Take 1 tablet by mouth every morning 2)  Nexium 40 Mg Cpdr (Esomeprazole magnesium) .... Take 1 capsule by mouth once a day 3)  Zestoretic 20-25 Mg Tabs (Lisinopril-hydrochlorothiazide) .... Take 1 tablet by mouth once a day 4)  Ferrous Sulfate 325 (65 Fe) Mg Tabs (Ferrous sulfate) .Marland Kitchen.. 1 by mouth three times a day 5)  Tramadol Hcl 50 Mg Tabs (Tramadol hcl) .... 1/2 to 1 tablet q 6 hours as needed pain 6)  Phentermine Hcl 37.5 Mg Tabs (Phentermine hcl) .... Take 1/2 pill by mouth two times a day 7)  Medroxyprogesterone  Acetate 10 Mg Tabs (Medroxyprogesterone acetate) .... 2 tabs by mouth daily x 10 days 8)  Promethazine Hcl 12.5 Mg Tabs (Promethazine hcl) .Marland Kitchen.. 1 tab by mouth every 8 hrs as needed nausea  Patient Instructions: 1)  We will call you with results from the Biopsy. If you have not heard from [place] in 1 week please call us. 2)  Please get your ultrasound of your pelvis 3)  We will call you with an appt time for the Gynecologist 4)  You can take 600mg  of ibuprofen every 6 hours with food as needed for cramping 5)  If you have severe pain, more bleeding than normal, please come back to be seen   Orders Added: 1)  Ultrasound [Ultrasound] 2)  [facility]-  Est Level  3 [[address]] 3)  Endometrial/Endocerv BX- [facility] [[address]]

## 2010-09-15 NOTE — Progress Notes (Signed)
Summary: refill  Phone Note Refill Request Call back at Home Phone [phone] Message from:  Patient  Refills Requested: Medication #1:  ZESTORETIC 20-25 MG TABS Take 1 tablet by mouth once a day  Medication #2:  NEXIUM 40 MG CPDR Take 1 capsule by mouth once a day [facility]  Initial call taken by: De Nurse,  April 28, 2010 2:05 PM    Prescriptions: ZESTORETIC 20-25 MG TABS (LISINOPRIL-HYDROCHLOROTHIAZIDE) Take 1 tablet by mouth once a day  #30 x 0   Entered and Authorized by:   Alvia Grove DO   Signed by:   Alvia Grove DO on 04/29/2010   Method used:   Electronically to        [facility]* (retail)       [address]       [address] Shipping/mailing       [place], [place]  [address]       Ph: [phone]       Fax: [phone]   RxID:   [id number] NEXIUM 40 MG CPDR (ESOMEPRAZOLE MAGNESIUM) Take 1 capsule by mouth once a day  #30 x 00   Entered and Authorized by:   Alvia Grove DO   Signed by:   Alvia Grove DO on 04/29/2010   Method used:   Electronically to        [facility]* (retail)       [address]       [address] Shipping/mailing       [place], [place]  [address]       Ph: [phone]       Fax: [phone]   RxID:   [id number]  refilled for one month.  Must be seen before I will give her any more refille

## 2010-09-15 NOTE — Progress Notes (Signed)
Summary: phn msg  Phone Note Call from Patient Call back at Work Phone [phone]   Caller: Patient Summary of Call: was given progestrion and her periods have not slowed down yet - going on for 3 1/2 weeks Initial call taken by: De Nurse,  August 04, 2010 10:04 AM  Follow-up for Phone Call         patient states bleeding has slowed some but now she is passing clots. she went  to ER Monday night 08/01/2010 because she felt so bad.  appointment scheduled today at 4:15 . she is at work now and ask for late appointment.  Follow-up by: Theresia Lo RN,  August 04, 2010 10:29 AM

## 2010-09-15 NOTE — Progress Notes (Signed)
----   Converted from flag ---- ---- 08/31/2010 3:31 PM, Loralee Pacas CMA wrote: pt would like for rx to be called/sent to outpt pharm.  thanks. ------------------------------       New/Updated Medications: MEDROXYPROGESTERONE ACETATE 10 MG TABS (MEDROXYPROGESTERONE ACETATE) 2 tabs by mouth two times a day for 7 dyas Prescriptions: MEDROXYPROGESTERONE ACETATE 10 MG TABS (MEDROXYPROGESTERONE ACETATE) 2 tabs by mouth two times a day for 7 dyas  #28 x 0   Entered and Authorized by:   Denny Levy MD   Signed by:   Denny Levy MD on 08/31/2010   Method used:   Electronically to        [facility]* (retail)       [address]       [address] Shipping/mailing       [place], [place]  [address]       Ph: [phone]       Fax: [phone]   RxID:   [id number]

## 2010-09-15 NOTE — Letter (Signed)
Summary: Generic Letter  [facility]  [address]   [place], [place] [address]   Phone: [phone]  Fax: [phone]    08/25/2010  KIMIE PIDCOCK [address] [place], [place]  [address]  Dear Ms. Bowden,  an appointment has been made for you to be seen at [facility] GYN clinic for October 13, 2010 at 1:45pm. You will need to arrive 10 minutes before your scheduled appointment time.  Please be sure to bring your insurance card (co-pay if you have one, if you are unable to pay you may be rescheduled), picture ID, all medications that you are currently taking.  If you are unable to keep this appoinment please call their office at [phone] 24 hours in advance to reschedule your appoinment.   Sincerely,   Loralee Pacas CMA

## 2010-09-15 NOTE — Progress Notes (Signed)
Summary: Rx  Phone Note Refill Request Call back at [phone]   Refills Requested: Medication #1:  NEXIUM 40 MG CPDR Take 1 capsule by mouth once a day Initial call taken by: Knox Royalty,  August 26, 2010 2:27 PM  Follow-up for Phone Call       Follow-up by: Golden Circle RN,  August 26, 2010 2:35 PM    Prescriptions: NEXIUM 40 MG CPDR (ESOMEPRAZOLE MAGNESIUM) Take 1 capsule by mouth once a day  #90 x 11   Entered by:   Golden Circle RN   Authorized by:   Priscella Mann MD   Signed by:   Golden Circle RN on 08/26/2010   Method used:   Electronically to        [facility]* (retail)       [address]       [address] [place] Shipping/mailing       [place], [place]  [address]       Ph: [phone]       Fax: [phone]   RxID:   [id number]

## 2010-09-21 ENCOUNTER — Ambulatory Visit (INDEPENDENT_AMBULATORY_CARE_PROVIDER_SITE_OTHER): Payer: Self-pay | Admitting: Obstetrics & Gynecology

## 2010-09-21 DIAGNOSIS — N920 Excessive and frequent menstruation with regular cycle: Secondary | ICD-10-CM

## 2010-09-21 DIAGNOSIS — Z01818 Encounter for other preprocedural examination: Secondary | ICD-10-CM

## 2010-10-06 ENCOUNTER — Encounter (HOSPITAL_COMMUNITY)
Admission: RE | Admit: 2010-10-06 | Discharge: 2010-10-06 | Disposition: A | Discharge status: Home / Self Care | Payer: 59 | Source: Ambulatory Visit | Attending: Obstetrics & Gynecology | Admitting: Obstetrics & Gynecology

## 2010-10-06 DIAGNOSIS — Z01818 Encounter for other preprocedural examination: Secondary | ICD-10-CM | POA: Insufficient documentation

## 2010-10-06 DIAGNOSIS — Z01812 Encounter for preprocedural laboratory examination: Secondary | ICD-10-CM | POA: Insufficient documentation

## 2010-10-06 DIAGNOSIS — Z01810 Encounter for preprocedural cardiovascular examination: Secondary | ICD-10-CM | POA: Insufficient documentation

## 2010-10-06 LAB — BASIC METABOLIC PANEL
CO2: 26 mEq/L (ref 19–32)
Calcium: 8.8 mg/dL (ref 8.4–10.5)
Chloride: 103 mEq/L (ref 96–112)
GFR calc Af Amer: >60 mL/min (ref >60)
Glucose, Bld: 91 mg/dL (ref 70–99)
Potassium: 3.6 mEq/L (ref 3.5–5.1)
Sodium: 136 mEq/L (ref 135–145)

## 2010-10-06 LAB — CBC
HCT: 31.6 % — ABNORMAL LOW (ref 36.0–46.0)
Hemoglobin: 11.2 g/dL — ABNORMAL LOW (ref 12.0–15.0)
RBC: 4.54 MIL/uL (ref 3.87–5.11)
WBC: 5.3 10*3/uL (ref 4.0–10.5)

## 2010-10-07 LAB — ABO/RH: ABO/RH(D): B POS

## 2010-10-07 LAB — TYPE AND SCREEN: Antibody Screen: NEGATIVE

## 2010-10-13 ENCOUNTER — Ambulatory Visit (HOSPITAL_COMMUNITY)
Admission: RE | Admit: 2010-10-13 | Discharge: 2010-10-14 | Disposition: A | Discharge status: Home / Self Care | Payer: 59 | Source: Ambulatory Visit | Attending: Obstetrics & Gynecology | Admitting: Obstetrics & Gynecology

## 2010-10-13 ENCOUNTER — Other Ambulatory Visit: Payer: Self-pay | Admitting: Obstetrics & Gynecology

## 2010-10-13 DIAGNOSIS — N920 Excessive and frequent menstruation with regular cycle: Secondary | ICD-10-CM | POA: Insufficient documentation

## 2010-10-13 HISTORY — PX: ENDOMETRIAL ABLATION: SHX621

## 2010-10-13 LAB — TYPE AND SCREEN: ABO/RH(D): B POS

## 2010-10-13 LAB — PREGNANCY, URINE: Preg Test, Ur: NEGATIVE

## 2010-10-14 ENCOUNTER — Inpatient Hospital Stay (HOSPITAL_COMMUNITY)
Admission: AD | Admit: 2010-10-14 | Discharge: 2010-10-14 | Disposition: A | Discharge status: Home / Self Care | Payer: 59 | Source: Ambulatory Visit | Attending: Obstetrics & Gynecology | Admitting: Obstetrics & Gynecology

## 2010-10-14 DIAGNOSIS — L0292 Furuncle, unspecified: Secondary | ICD-10-CM

## 2010-10-14 DIAGNOSIS — L0293 Carbuncle, unspecified: Secondary | ICD-10-CM | POA: Insufficient documentation

## 2010-10-20 ENCOUNTER — Other Ambulatory Visit: Payer: Self-pay | Admitting: Family Medicine

## 2010-10-20 DIAGNOSIS — I10 Essential (primary) hypertension: Secondary | ICD-10-CM

## 2010-10-20 MED ORDER — LISINOPRIL-HYDROCHLOROTHIAZIDE 20-25 MG PO TABS: 1.0000 | ORAL_TABLET | Freq: Every day | ORAL | Status: DC

## 2010-10-20 NOTE — Telephone Encounter (Signed)
Patient advised that rx will be sent in for one month and to call for appointment .

## 2010-10-20 NOTE — Op Note (Signed)
Tara Campos, Tara Campos                ACCOUNT NO.:  [id number]  MEDICAL RECORD NO.:  [id number]           PATIENT TYPE:  O  LOCATION:  [facility]                          [facility]  PHYSICIAN:  Horton Chin, MD DATE OF BIRTH:  1965/10/06  DATE OF PROCEDURE:  10/13/2010 DATE OF DISCHARGE:                              OPERATIVE REPORT   PREOPERATIVE DIAGNOSIS:  Menometrorrhagia.  POSTOPERATIVE DIAGNOSIS:  Menometrorrhagia.  PROCEDURE:  Hysteroscopy, D and C, and ThermaChoice endometrial ablation.  SURGEON:  Horton Chin, MD  ANESTHESIOLOGIST:  Dr. Dana Allan  ANESTHESIA:  General and a paracervical block.  IV FLUIDS:  700 mL of lactated Ringer.  ESTIMATED BLOOD LOSS:  Minimal.  URINE OUTPUT:  In-and-out catheterization prior to the case.  INDICATIONS:  The patient is a 45 year old gravida 3, para 3 with a history of menometrorrhagia and obesity who desired ablation for management of her menometrorrhagia.  Of note, the patient does have a fibroid uterus, largest fibroid was 4.3 cm and subserosal, and the patient also has a history of 3 prior cesarean sections.  Prior to the ablation, the patient was counseled regarding the risks of the ablation including but not limited to bleeding, infection, injury to the uterus or surrounding organs, possible need for additional procedures especially if the ablation does not control her bleeding and routine postoperative expectations were also discussed.  Informed consent was obtained.  FINDINGS:  Uterus sounded to 11 cm.  There was diffusely proliferative endometrium on hysteroscopy.  SPECIMENS:  Endometrial curettings sent to Pathology.  COMPLICATIONS:  None immediate.  PROCEDURE DETAILS:  The patient had sequential compression devices applied to her lower extremities while in the preoperative area.  She was then taken to the operating room where general analgesia was administered and found to be adequate.  The patient  was then placed in the dorsal lithotomy position, and prepped and draped in a sterile manner. An in-and-out catheterization of her bladder was also done at this point.  After an adequate time-out was performed, a vaginal speculum was placed, and her cervix was grasped with a tenaculum.  A paracervical block was administered using 30 mL of 0.5% Marcaine, the uterus was then sounded to 11 cm, and a diagnostic hysteroscopy was done at this point using lactated Ringer as a distention medium.  This showed  diffuse proliferative endometrium.  At this point, the endometrial ablation using the ThermaChoice apparatus was then done according to protocol.  Total ablation time was 8 minutes; temperature 87 degrees Celsius and the pressures were anywhere between 160 mmHg to 180 mmHg. After the ablation was done, a diagnostic hysteroscope was reinserted which showed good blanching effect on the endometrium.  The patient had minimal bleeding after the case and all instruments were removed from the patient's pelvis.  The patient tolerated the procedure well. Sponge, instrument, and needle counts were correct x2.  She was taken to recovery room in stable condition.  DISPOSITION:  The patient was given a prescription for Percocet 30 tablets and Ibuprofen 30 tablets. A followup appointment has been made for the patient in the [facility] on  November 03, 2010, at 1:45 p.m. This appointment was communicated to the patient.     Horton Chin, MD     UAA/MEDQ  D:  10/13/2010  T:  10/14/2010  Job:  [id number]  Electronically Signed by Jaynie Collins MD on 10/19/2010 12:17:13 PM

## 2010-10-20 NOTE — Telephone Encounter (Signed)
Please review and refill if indicated for this patient of Dr. Deirdre Priest.

## 2010-10-20 NOTE — Telephone Encounter (Signed)
Rx faxed. Thank you for you help Larita Fife and for asking patient to make appointment. Please inform patient that Rx has been sent. Thank you again.

## 2010-10-24 LAB — URINALYSIS, ROUTINE W REFLEX MICROSCOPIC
Bilirubin Urine: NEGATIVE
Glucose, UA: NEGATIVE mg/dL
Protein, ur: NEGATIVE mg/dL

## 2010-10-24 LAB — CBC
HCT: 31 % — ABNORMAL LOW (ref 36.0–46.0)
MCV: 70.8 fL — ABNORMAL LOW (ref 78.0–100.0)
Platelets: 310 10*3/uL (ref 150–400)
RBC: 4.38 MIL/uL (ref 3.87–5.11)
RDW: 15.9 % — ABNORMAL HIGH (ref 11.5–15.5)
WBC: 7.3 10*3/uL (ref 4.0–10.5)

## 2010-10-24 LAB — DIFFERENTIAL
Basophils Absolute: 0 10*3/uL (ref 0.0–0.1)
Eosinophils Relative: 1 % (ref 0–5)
Lymphocytes Relative: 34 % (ref 12–46)
Lymphs Abs: 2.4 10*3/uL (ref 0.7–4.0)
Monocytes Absolute: 0.6 10*3/uL (ref 0.1–1.0)
Neutro Abs: 4.1 10*3/uL (ref 1.7–7.7)

## 2010-10-24 LAB — POCT PREGNANCY, URINE: Preg Test, Ur: NEGATIVE

## 2010-10-24 LAB — WET PREP, GENITAL
Trich, Wet Prep: NONE SEEN
WBC, Wet Prep HPF POC: NONE SEEN
Yeast Wet Prep HPF POC: NONE SEEN

## 2010-10-24 LAB — URINE MICROSCOPIC-ADD ON

## 2010-10-24 LAB — TYPE AND SCREEN: Antibody Screen: NEGATIVE

## 2010-10-24 LAB — GC/CHLAMYDIA PROBE AMP, GENITAL: GC Probe Amp, Genital: NEGATIVE

## 2010-10-30 LAB — POCT URINALYSIS DIP (DEVICE)
Glucose, UA: NEGATIVE mg/dL
Ketones, ur: NEGATIVE mg/dL
Specific Gravity, Urine: 1.025 (ref 1.005–1.030)
Urobilinogen, UA: 0.2 mg/dL (ref 0.0–1.0)

## 2010-10-31 NOTE — Progress Notes (Signed)
Tara Campos, Tara Campos NO.:  [id number]  MEDICAL RECORD NO.:  [id number]           PATIENT TYPE:  A  LOCATION:  [facility]                   FACILITY:  [facility]  PHYSICIAN:  Jaynie Collins, MD     DATE OF BIRTH:  May 20, 1966  DATE OF SERVICE:  09/21/2010                                 CLINIC NOTE  REASON FOR VISIT:  Management of dysfunctional uterine bleeding/menometrorrhagia.  HISTORY OF PRESENT ILLNESS:  The patient is a 45 year old gravida 3, para 3 with a history of morbid obesity and menometrorrhagia secondary to fibroids who is here for discussion about management of her menometrorrhagia.  The patient is followed by Dr. __________ at the [facility] and has undergone evaluation in the form of an ultrasound which showed a 10-week sized uterus enlarged with multiple fibroids, the largest about 4.3 in maximal diameter which is subserosal, and there were also 3 mural fibroids all measuring less than 2 cm in diameter.  It also showed a homogeneously echogenic endometrium with anterior-posterior width of 11.6 mm and normal ovaries.  The patient had normal Pap smear and also had a benign endometrial biopsy that was done on August 18, 2010.  Given these benign results, the patient was told to come here for further discussion about management. She does say that she had a period of abnormal bleeding over the last year where she was amenorrheic for 6 months but then in December her periods did come back and have been very heavy and erratic since then. She has been on multiple courses of Provera which she says does not seem to help her bleeding because it only stops of bleeding for a couple of days.  She wants to know what her options are for the management of this bleeding.  Apart from the dysfunctional uterine bleeding, the patient has no other gynecologic concerns.  PAST MEDICAL HISTORY:  Hypertension, obesity, and gastroesophageal reflux  disease.  MEDICATIONS:  She is on aspirin 81 mg daily, Nexium 40 mg daily, lisinopril/hydrochlorothiazide 20/25 mg daily, ferrous sulfate 325 mg t.i.d., tramadol 50 mg p.o. q.6 h p.r.n. pain, and phentermine 37.5 mg half a pill as needed twice a day.  PAST SURGICAL HISTORY:  Three cesarean sections.  PAST OB/GYN HISTORY:  Menstrual history as above.  The patient has had 3 cesarean sections.  SOCIAL HISTORY:  The patient denies any smoking, alcohol, or illicit drug use.  FAMILY HISTORY:  Negative for any gynecologic neoplasia history.  She does have a family history of hypertension.  PHYSICAL EXAMINATION:  VITAL SIGNS:  Temperature is 98.2, pulse 98, blood pressure 119/79, weight 283.8 pounds, height 6 feet 7-1/2 inches. GENERAL:  No apparent distress. ABDOMEN:  Soft, nontender, nondistended, and obese habitus. PELVIC:  Deferred. EXTREMITIES:  No cyanosis, clubbing, or edema.  ASSESSMENT/PLAN:  The patient is a 45 year old gravida 3, para 3 with menometrorrhagia and fibroids who is here for discussion of management options.  The patient was given the option of a Mirena IUD as treatment for her menometrorrhagia, but she does not want to do this modality. She was also told about the option of endometrial  ablation and different modalities of this were discussed and the risks reviewed, also discussed hysterectomy and also the risks of hysterectomy were reviewed in detail after much discussion and all her questions have been answered about all these modalities.  The patient is interested in the balloon endometrial ablation (ThermaChoice).  She was told that this will be scheduled as soon as possible for her and in the meantime, she was given a prescription for Megace 60 mg b.i.d. to use for the next 10 days to see if this will help her symptoms, as the patient says that she has not had much good experience with Provera, Megace may give her a slightly better response.  She was given a  patient education pamphlet of ThermaChoice and told to expect to be contacted by the surgical scheduler regarding the time and date of her surgery.  On evaluation of her medications, the patient is noted to be on phentermine.  She was told that she would need to be off this medication for 2 weeks given the risk of cardiac side effects before surgery is done and the patient does verbalize understanding of this.          ______________________________ Jaynie Collins, MD    UA/MEDQ  D:  09/21/2010  T:  09/22/2010  Job:  [id number]

## 2010-11-03 ENCOUNTER — Ambulatory Visit: Payer: 59 | Admitting: Obstetrics & Gynecology

## 2010-11-03 DIAGNOSIS — Z09 Encounter for follow-up examination after completed treatment for conditions other than malignant neoplasm: Secondary | ICD-10-CM

## 2010-11-04 ENCOUNTER — Telehealth: Payer: Self-pay | Admitting: Family Medicine

## 2010-11-04 NOTE — Progress Notes (Signed)
NAMEJELISA, Tara Campos NO.:  [id number]  MEDICAL RECORD NO.:  [id number]           PATIENT TYPE:  A  LOCATION:  [facility]                   FACILITY:  [facility]  PHYSICIAN:  Jaynie Collins, MD     DATE OF BIRTH:  1965-08-31  DATE OF SERVICE:  11/03/2010                                 CLINIC NOTE  REASON FOR VISIT:  Postoperative evaluation.  Ms. Kreher is a 45 year old gravida 3, para 3 who underwent ThermaChoice endometrial ablation on October 13, 2010.  The patient says that since she has had the surgery she has had no bleeding and she is very satisfied with this modality.  However, she did have some blisters that were noted on her lower abdomen next to her prior cesarean section incision which she attributes to possible Betadine sensitivity.  These blisters have been treated in the [facility] Admissions [facility].  She still complains that she continues to have purulent discharge and she is worried about them being infected.  She has no other gynecologic concerns.  PHYSICAL EXAMINATION:  GENERAL:  Temperature is 98.1, pulse 103, blood pressure 123/88, and weight 291.9 pounds. GENERAL:  No apparent distress. ABDOMEN:  Soft, nontender, and nondistended.  The patient does have about a 3-cm area that is having some purulent discharge on the inferior aspect of her vertical cesarean section incision and there is about a 1- cm diameter lesion that looks similar to that, but on her right side about 4 cm lateral to the incision.  There is some erythema that is noted around these lesions, but no induration. PELVIC:  The patient has normal external female genitalia.  Pink, well- rugated vagina.  Scant amount of brown discharge.  Normal cervical os. No other lesions were seen.  ASSESSMENT/PLAN:  The patient is a 45 year old gravida 3, para 3 who is here for postoperative check after the ThermaChoice endometrial ablation.  When it comes to her menometrorrhagia standpoint, she  is doing very well and has no complaints.  She did have reaction to possibly Betadine in the form of blisters on her lower abdomen, which look infected.  As such, she will be given Bactrim DS 1 tablet twice a day for 7 days and she will be also given pain medications in the form of Percocet and Motrin.  She was told to call or come back if these lesions do not get better on their own, and BETADINE was listed as an allergy in her physical chart.  The patient was told that sometimes her endometrium could regenerate after an ablation and if so she might have some bleeding.  If this happens, we might need to re-ablate versus doing other management, but for now we are just going to observe and see.  The patient's last Pap smear was in December 2011.  Her last mammogram was in October 2009 and she will schedule a mammogram soon.          ______________________________ Jaynie Collins, MD    UA/MEDQ  D:  11/03/2010  T:  11/04/2010  Job:  [id number]

## 2010-11-04 NOTE — Telephone Encounter (Signed)
Pt have an appt with Dr. Wallene Huh on 4/4 for meds check & refill.  Could not get an earlier one with provider before rx run out.

## 2010-11-16 ENCOUNTER — Ambulatory Visit (INDEPENDENT_AMBULATORY_CARE_PROVIDER_SITE_OTHER): Payer: 59 | Admitting: Family Medicine

## 2010-11-16 ENCOUNTER — Encounter: Payer: Self-pay | Admitting: Family Medicine

## 2010-11-16 DIAGNOSIS — R6889 Other general symptoms and signs: Secondary | ICD-10-CM

## 2010-11-16 DIAGNOSIS — D509 Iron deficiency anemia, unspecified: Secondary | ICD-10-CM

## 2010-11-16 DIAGNOSIS — R7989 Other specified abnormal findings of blood chemistry: Secondary | ICD-10-CM

## 2010-11-16 DIAGNOSIS — I10 Essential (primary) hypertension: Secondary | ICD-10-CM

## 2010-11-16 DIAGNOSIS — N926 Irregular menstruation, unspecified: Secondary | ICD-10-CM

## 2010-11-16 DIAGNOSIS — E669 Obesity, unspecified: Secondary | ICD-10-CM

## 2010-11-16 LAB — TSH: TSH: 3.379 u[IU]/mL (ref 0.350–4.500)

## 2010-11-16 LAB — FERRITIN: Ferritin: 72 ng/mL (ref 10–291)

## 2010-11-16 LAB — T4, FREE: Free T4: 0.84 ng/dL (ref 0.80–1.80)

## 2010-11-16 MED ORDER — TRAMADOL HCL 50 MG PO TABS: 50.0000 mg | ORAL_TABLET | Freq: Three times a day (TID) | ORAL | Status: DC | PRN

## 2010-11-16 MED ORDER — PHENTERMINE HCL 37.5 MG PO TABS: ORAL_TABLET | ORAL | Status: DC

## 2010-11-16 MED ORDER — ESOMEPRAZOLE MAGNESIUM 40 MG PO CPDR: 40.0000 mg | DELAYED_RELEASE_CAPSULE | Freq: Every day | ORAL | Status: DC | PRN

## 2010-11-16 MED ORDER — LISINOPRIL-HYDROCHLOROTHIAZIDE 20-25 MG PO TABS: 1.0000 | ORAL_TABLET | Freq: Every day | ORAL | Status: DC

## 2010-11-16 NOTE — Progress Notes (Signed)
  Subjective:    Patient ID: Tara Campos, female    DOB: 19-Sep-1965, 45 y.o.   MRN: [id number]  HPI  HYPERTENSION Disease Monitoring Blood pressure range-has been controlled at recent Dr visits Chest pain- No      Dyspnea- no Medications Compliance- daily zestoretic Lightheadedness- no   Edema- no  Obesity Has been on phenteramine in the past with good results and no side effects.   Does not want to take for prolonged period.  Her weight causes joint pain and fatigue.  She is working on exercise and portion control  Abnormal TSH Had thyroid tested during recent workup for menstraul bleeding and was slightly high and T4 very slightly low.  Does feel fatigued.  No thyroid swelling or hair changes  Anemia Had heavy menstrual periods but had recent ablation.  Taking iron three times daily.  No chest pain or lightheadedness  PMH - no smoking  Review of Systems see HPI     Objective:   Physical Exam    Neck:  No deformities, thyromegaly, masses, or tenderness noted.   Supple with full range of motion without pain. Lungs:  Normal respiratory effort, chest expands symmetrically. Lungs are clear to auscultation, no crackles or wheezes. Heart - Regular rate and rhythm.  No murmurs, gallops or rubs.    Extremities:  No cyanosis, edema, or deformity noted with good range of motion of all major joints.       Assessment & Plan:

## 2010-11-16 NOTE — Assessment & Plan Note (Signed)
Had ablation in October 13 2010

## 2010-11-16 NOTE — Assessment & Plan Note (Signed)
Prior labs very slightly abnormal.  No thyromegaly.  Will recheck labs

## 2010-11-16 NOTE — Assessment & Plan Note (Signed)
Discussed pros and cons of phentermine.  She has taken before and has realistic goals.  Will prescribe for 6 months but will not chronically.  Encouraged her to set goals and to continue exercise and diet modification

## 2010-11-16 NOTE — Assessment & Plan Note (Signed)
Well controlled. -continue current meds  

## 2010-11-16 NOTE — Patient Instructions (Signed)
I will call you if your tests are not good.  Otherwise I will send you a letter.  If you do not hear from me with in 2 weeks please call our office.    Would use the phenteramine for no more than 6 months.  Then need to come in to reassess Come in if your bp is > 140/90 regularly Stop the iron tablet in June then we will recheck your blood test in 6 months Portion control is the key to weight loss

## 2010-11-16 NOTE — Assessment & Plan Note (Signed)
Should resolve now that heavy menstrual periods are resolved.  Continue Iron for 3 more months then stop

## 2010-11-17 ENCOUNTER — Encounter: Payer: Self-pay | Admitting: Family Medicine

## 2010-12-26 ENCOUNTER — Ambulatory Visit (INDEPENDENT_AMBULATORY_CARE_PROVIDER_SITE_OTHER): Payer: 59 | Admitting: Family Medicine

## 2010-12-26 ENCOUNTER — Encounter: Payer: Self-pay | Admitting: Family Medicine

## 2010-12-26 VITALS — BP 125/87 | HR 106 | Temp 99.0°F | Ht 67.5 in | Wt 289.0 lb

## 2010-12-26 DIAGNOSIS — J069 Acute upper respiratory infection, unspecified: Secondary | ICD-10-CM

## 2010-12-26 DIAGNOSIS — J029 Acute pharyngitis, unspecified: Secondary | ICD-10-CM

## 2010-12-26 LAB — POCT RAPID STREP A (OFFICE): Rapid Strep A Screen: NEGATIVE

## 2010-12-26 NOTE — Progress Notes (Signed)
  Subjective:    Patient ID: Tara Campos, female    DOB: 1965-12-22, 45 y.o.   MRN: [id number]  Sore Throat  This is a new problem. The current episode started in the past 7 days. The problem has been gradually improving. Neither side of throat is experiencing more pain than the other. There has been no fever. The pain is mild. Associated symptoms include congestion, coughing, a hoarse voice and a plugged ear sensation. Pertinent negatives include no abdominal pain, diarrhea, drooling, ear discharge, ear pain, headaches, neck pain, shortness of breath, stridor, swollen glands, trouble swallowing or vomiting. She has had no exposure to strep or mono. 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 Treatments tried: benadryl  The treatment provided moderate relief.      Review of Systems  HENT: Positive for congestion and hoarse voice. Negative for ear pain, drooling, trouble swallowing, neck pain and ear discharge.   Respiratory: Positive for cough. Negative for shortness of breath and stridor.   Gastrointestinal: Negative for vomiting, abdominal pain and diarrhea.  Neurological: Negative for headaches.       Objective:   Physical Exam  Constitutional: She appears well-developed and well-nourished. No distress.  HENT:  Right Ear: External ear normal.  Left Ear: External ear normal.  Mouth/Throat: Oropharynx is clear and moist. No oropharyngeal exudate.  Eyes: Conjunctivae are normal. Right eye exhibits no discharge. Left eye exhibits no discharge.  Neck: Normal range of motion.  Cardiovascular: Normal rate and regular rhythm.   Pulmonary/Chest: Effort normal and breath sounds normal. No respiratory distress. She has no wheezes. She has no rales.  Abdominal: Soft. Bowel sounds are normal.  Lymphadenopathy:    She has no cervical adenopathy.  Skin: Skin is warm and dry. No pallor.          Assessment & Plan:

## 2010-12-26 NOTE — Assessment & Plan Note (Signed)
Likely viral. No red flags. Advised as per patient handout. Symptomatic management. Follow up with PCP as needed.

## 2010-12-26 NOTE — Patient Instructions (Signed)
-   Take over the counter cough medication (make sure that the cough medication does not have Sudafed or pseudoephedrine, so that it is safe to use with high blood pressure). It will have a red heart on the box to show that it is safe to use. If the over the counter cough medicine has acetaminopen or Tylenol in it, do not give additional Tylenol as these are the same thing, otherwise can give Tylenol for fevers.  - You can use Chloraseptic spray or throat lozenges, for sore throat.   Follow up with your regular doctor as needed   Common Cold, Adult An upper respiratory tract infection, or cold, is a viral infection of the air passages to the lung. Colds are contagious, especially during the first 3 or 4 days. Antibiotics cannot cure a cold. Cold germs are spread by coughs, sneezes, and hand to hand contact. A respiratory tract infection usually clears up in a few days, but some people may be sick for a week or two. HOME CARE INSTRUCTIONS  Only take over-the-counter or prescription medicines for pain, discomfort, or fever as directed by your caregiver.   Be careful not to blow your nose too hard. This may cause a nosebleed.   Use a cool-mist humidifier (vaporizer) to increase air moisture. This will make it easier for you to breath. Do not use hot steam.   Rest as much as possible and get plenty of sleep.   Wash your hands often, especially after you blow your nose. Cover your mouth and nose with a tissue when you sneeze or cough.   Drink at least 8 glasses of clear liquids every day, such as water, fruit juices, tea, clear soups, and carbonated beverages.  SEEK MEDICAL CARE IF:  An oral temperature above 104.0 lasts 4 days or more, and is not controlled by medication.   You have a sore throat that gets worse or you see white or yellow spots in your throat.   Your cough gets worse or lasts more than 10 days.   You have a rash somewhere on your skin. You have large and tender lumps in your  neck.   You have an earache or a headache.   You have thick, greenish or yellowish discharge from your nose.   You cough-up thick yellow, green, gray or bloody mucus (secretions).  SEEK IMMEDIATE MEDICAL CARE IF: You have trouble breathing, chest pain, or your skin or nails look gray or blue. MAKE SURE YOU:   Understand these instructions.   Will watch your condition.   Will get help right away if you are not doing well or get worse.  Document Released: 07/28/2000 Document Re-Released: 07/13/2008 [facility] Patient Information 2011 [place], [place].

## 2011-02-24 ENCOUNTER — Ambulatory Visit: Payer: 59 | Admitting: Family Medicine

## 2011-03-01 ENCOUNTER — Encounter: Payer: Self-pay | Admitting: Family Medicine

## 2011-03-01 ENCOUNTER — Ambulatory Visit (INDEPENDENT_AMBULATORY_CARE_PROVIDER_SITE_OTHER): Payer: 59 | Admitting: Family Medicine

## 2011-03-01 DIAGNOSIS — F17201 Nicotine dependence, unspecified, in remission: Secondary | ICD-10-CM

## 2011-03-01 DIAGNOSIS — E669 Obesity, unspecified: Secondary | ICD-10-CM

## 2011-03-01 DIAGNOSIS — Z87891 Personal history of nicotine dependence: Secondary | ICD-10-CM

## 2011-03-01 MED ORDER — BUPROPION HCL ER (XL) 300 MG PO TB24: 300.0000 mg | ORAL_TABLET | ORAL | Status: DC

## 2011-03-01 NOTE — Patient Instructions (Signed)
GREAT JOB!  Keep up the good work!  I will see you in 3 months to talk about tapering this medication

## 2011-03-01 NOTE — Assessment & Plan Note (Signed)
On phentermine.  No side effects, slow weight loss.  No chest pain.  Will continue 2 more months per previous plan.

## 2011-03-01 NOTE — Assessment & Plan Note (Signed)
Pt not smoking since April.  Has been on wellbutrin for 3 months.  Would like to continue.  Plan to give 3 month supply, see back in 3 months to taper.

## 2011-03-01 NOTE — Progress Notes (Signed)
  Subjective:    Patient ID: Tara Campos, female    DOB: 1965/12/03, 45 y.o.   MRN: [id number]  HPI  Pt here to discuss smoking cessation.  No tobacco use since April.  Got wellbutrin from employee health and has been on for 3 months.  Now wants refill to continue.    Weight loss- feeling better, in smaller clothes.  No CP, SOB, HA or insomnia.  Review of Systems    Denies CP, SOB, HA, N/V/D, fever  Objective:   Physical Exam    Vital signs reviewed General appearance - alert, well appearing, and in no distress and oriented to person, place, and time Heart - normal rate, regular rhythm, normal S1, S2, no murmurs, rubs, clicks or gallops Chest - clear to auscultation, no wheezes, rales or rhonchi, symmetric air entry, no tachypnea, retractions or cyanosis     Assessment & Plan:

## 2011-04-14 ENCOUNTER — Encounter: Payer: Self-pay | Admitting: Family Medicine

## 2011-04-14 ENCOUNTER — Ambulatory Visit (INDEPENDENT_AMBULATORY_CARE_PROVIDER_SITE_OTHER): Payer: 59 | Admitting: Family Medicine

## 2011-04-14 DIAGNOSIS — D259 Leiomyoma of uterus, unspecified: Secondary | ICD-10-CM

## 2011-04-14 DIAGNOSIS — Z87891 Personal history of nicotine dependence: Secondary | ICD-10-CM

## 2011-04-14 DIAGNOSIS — R1013 Epigastric pain: Secondary | ICD-10-CM

## 2011-04-14 DIAGNOSIS — F17201 Nicotine dependence, unspecified, in remission: Secondary | ICD-10-CM

## 2011-04-14 DIAGNOSIS — E669 Obesity, unspecified: Secondary | ICD-10-CM

## 2011-04-14 DIAGNOSIS — I10 Essential (primary) hypertension: Secondary | ICD-10-CM

## 2011-04-14 MED ORDER — LISINOPRIL-HYDROCHLOROTHIAZIDE 20-25 MG PO TABS: 1.0000 | ORAL_TABLET | Freq: Every day | ORAL | Status: DC

## 2011-04-14 MED ORDER — PHENTERMINE HCL 37.5 MG PO TABS: ORAL_TABLET | ORAL | Status: DC

## 2011-04-14 NOTE — Assessment & Plan Note (Signed)
Continues to not smoke. Quit in April. Continue Wellbutrin for now since patient feels this is helping her and since she is still having cravings. Follow-up on smoking in 3 months.

## 2011-04-14 NOTE — Assessment & Plan Note (Signed)
Unclear etiology per patient. No symptoms past several months. Took tramadol for this issue.

## 2011-04-14 NOTE — Progress Notes (Signed)
  Subjective:    Patient ID: Tara Campos, female    DOB: 15-Jun-1966, 45 y.o.   MRN: [id number]  HPI Here to meet me and to get medication refills Doing well overall with no complaints  1. Tobacco Quit smoking April.  Able to stick with it. Wellbutrin helps.   2. History of heavy periods Resolved after ablation in March.  Stopped taking Fe few months ago ROS: denies fatigue  3. HTN Compliant with medication ROS: denies chest pain, headache, edema  4. Obesity Taking phentermine Trying to eat better Saw nutritionist in the past but not interested currently Vice is eating late at night   Review of Systems Denies nausea/vomiting, diarrhea, abdominal pain for past several months.     Objective:   Physical Exam Gen: NAD, well-dressed/-groomed Psych: pleasant CV: RRR, no murmurs/gallops Pulm: CTAB Abd: obese, NABS, soft, NT Ext: some pedal edema but non-pitting Skin: warm, dry    Assessment & Plan:

## 2011-04-14 NOTE — Assessment & Plan Note (Signed)
Well controlled. Continue current medication.  

## 2011-04-14 NOTE — Patient Instructions (Signed)
It was so nice to meet you.  Try planning snack ahead of time.   Follow-up with me in 3 months regarding weight and smoking.

## 2011-04-14 NOTE — Assessment & Plan Note (Signed)
Continue phentermine for now. Discussed nibbling on healthy snack at night and packing for the next day during this time as well. Goal right now is do gain a healthy approach to eating. Discussed weight loss programs; not interested currently. Seems more interested in weight loss supplements. Follow-up in 3 months regarding this. Will need to encourage daily exercise.

## 2011-05-08 LAB — DIFFERENTIAL
Basophils Absolute: 0.1
Eosinophils Absolute: 0.1
Lymphocytes Relative: 31
Monocytes Relative: 7
Neutro Abs: 2.9
Neutrophils Relative %: 60

## 2011-05-08 LAB — BASIC METABOLIC PANEL
BUN: 12
Calcium: 9.1
Creatinine, Ser: 0.93
GFR calc Af Amer: >60
GFR calc non Af Amer: >60

## 2011-05-08 LAB — POCT CARDIAC MARKERS
Myoglobin, poc: 64
Operator id: 8

## 2011-05-08 LAB — CBC
RBC: 4.64
WBC: 5.1

## 2011-06-14 ENCOUNTER — Other Ambulatory Visit: Payer: Self-pay | Admitting: Family Medicine

## 2011-06-14 NOTE — Telephone Encounter (Signed)
Refill request

## 2011-06-15 NOTE — Telephone Encounter (Signed)
Needs follow-up within next month.

## 2011-06-20 ENCOUNTER — Other Ambulatory Visit: Payer: Self-pay | Admitting: Family Medicine

## 2011-06-20 MED ORDER — BUPROPION HCL ER (XL) 300 MG PO TB24: 300.0000 mg | ORAL_TABLET | ORAL | Status: DC

## 2011-08-03 IMAGING — CR DG KNEE 3 VIEWS*R*
3 series · 3 of 3 positions shown · non-contrast
Comparison: None

CLINICAL DATA: Right knee pain.

RIGHT KNEE - 3 VIEW

[w knee ap right]
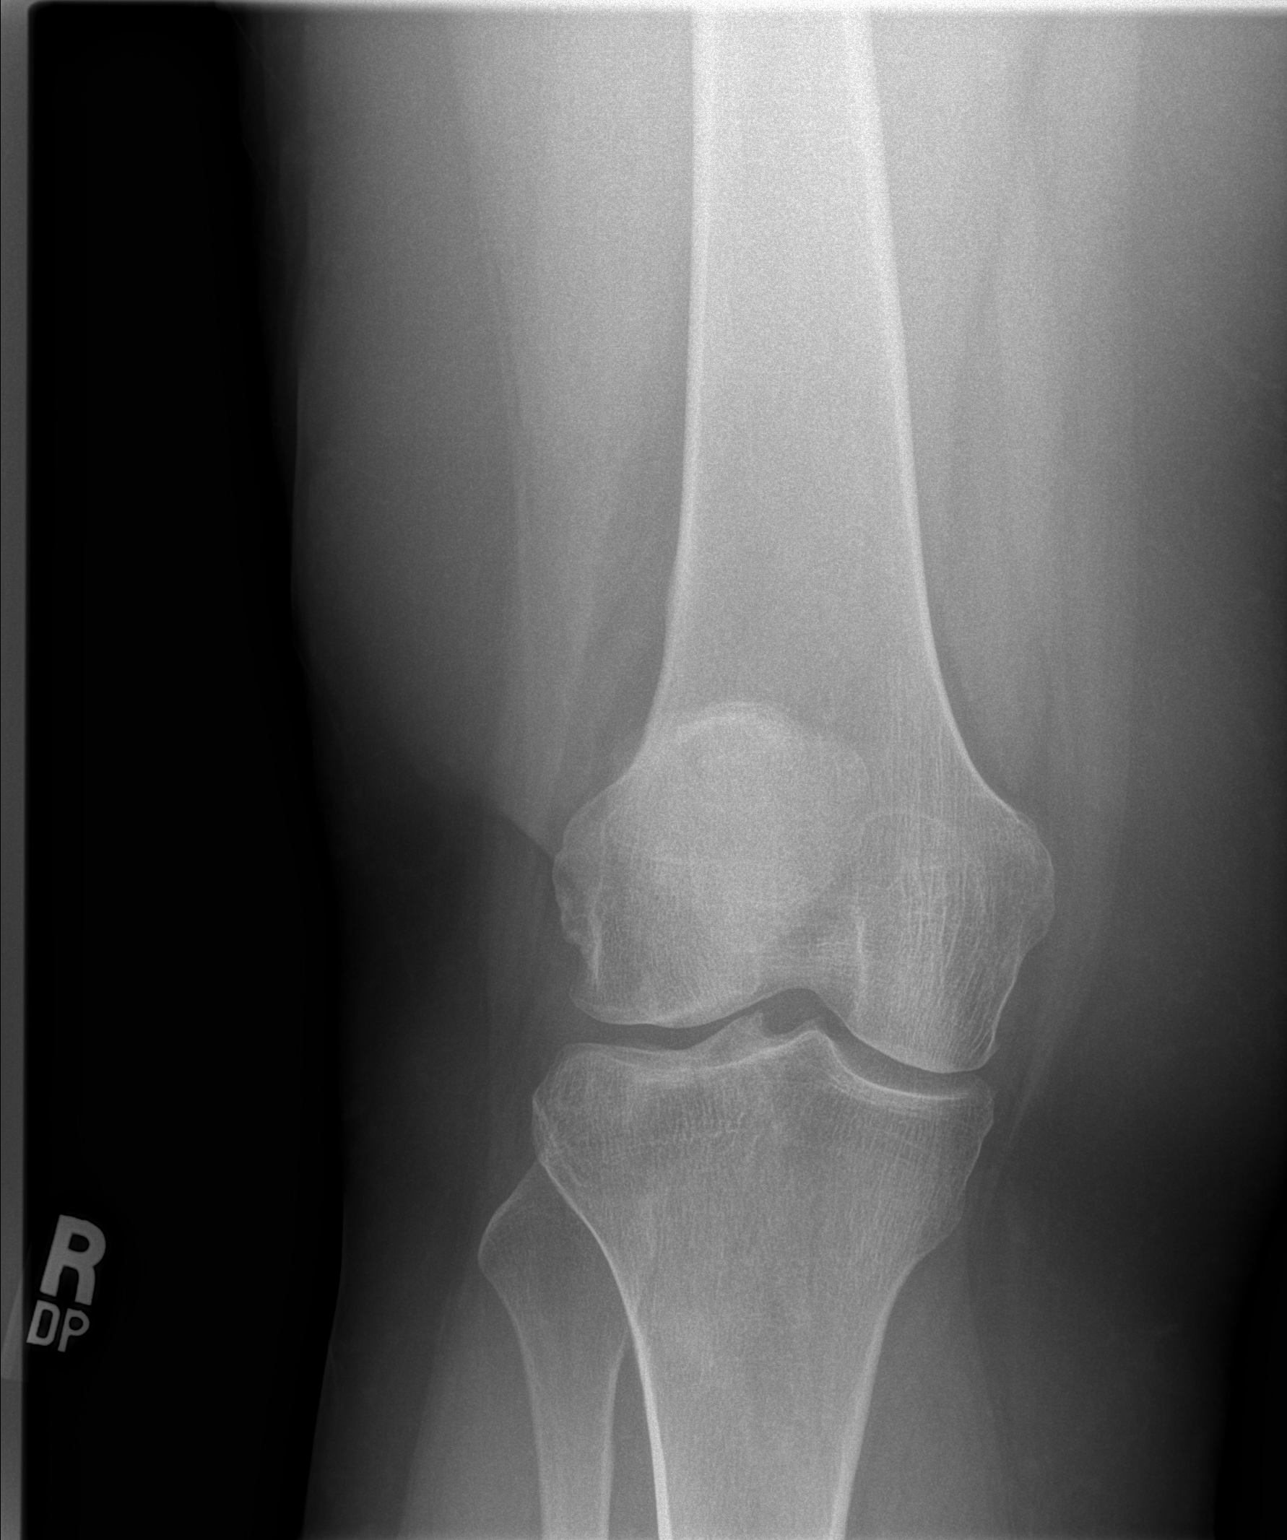

[w knee lat. right]
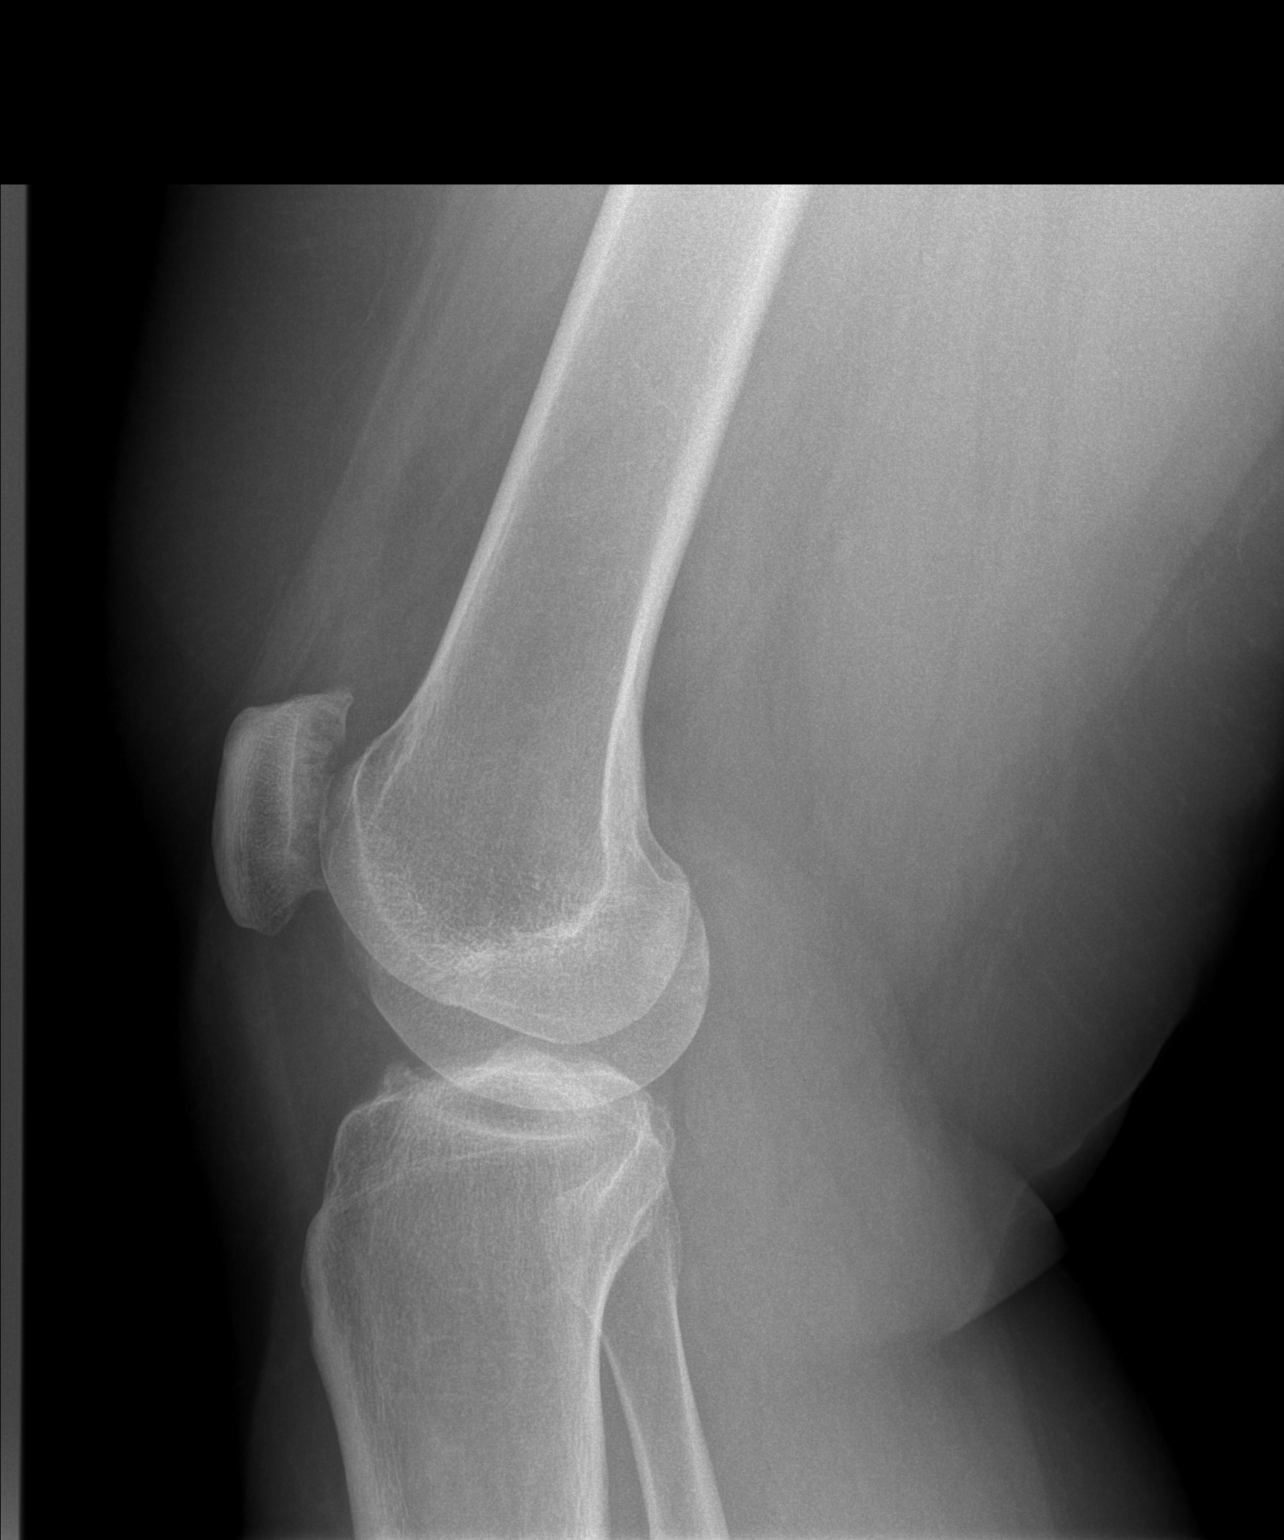

[view not recorded]
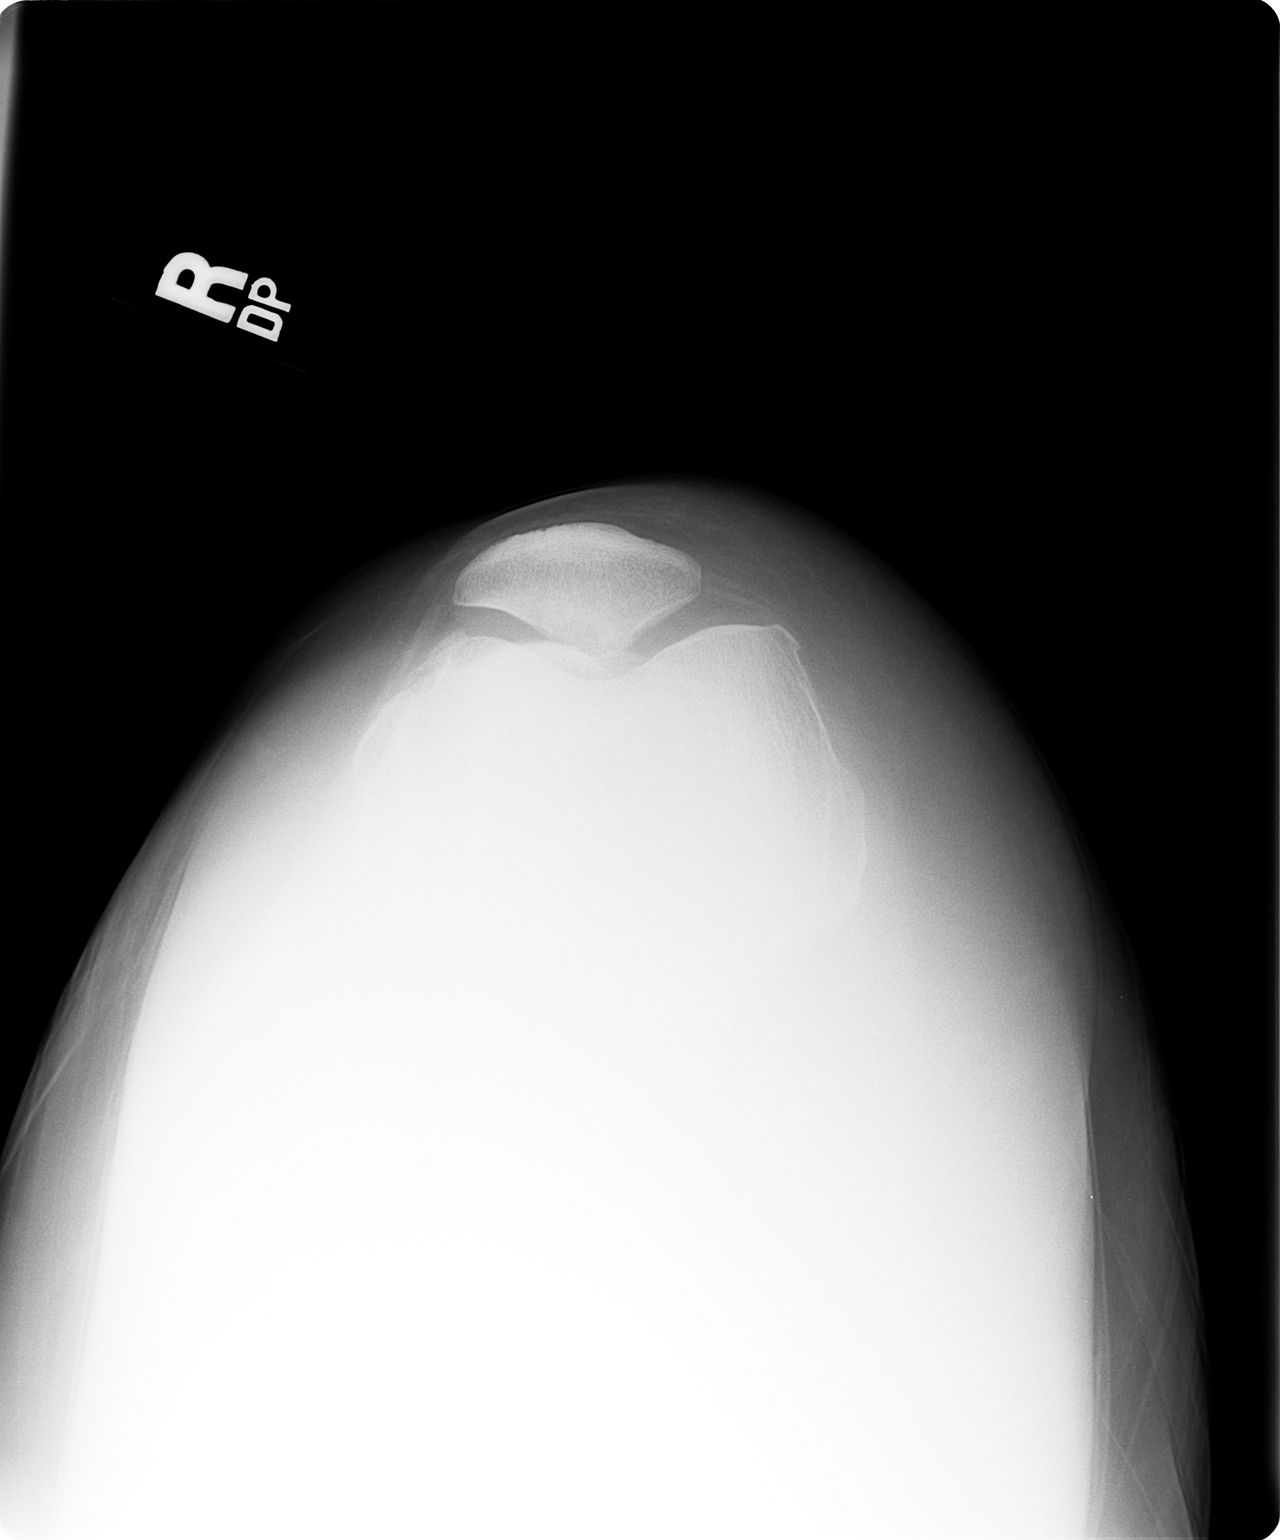

[3 of 3 positions shown; findings below may reference images not displayed]

FINDINGS: There are mild tricompartmental degenerative changes.  No
acute bony findings.  Very small joint effusion.
IMPRESSION: 1.  Mild degenerative changes.
2.  No acute bony findings.
3.  Small joint effusion.

## 2011-09-21 ENCOUNTER — Encounter: Payer: Self-pay | Admitting: Family Medicine

## 2011-09-21 ENCOUNTER — Ambulatory Visit (INDEPENDENT_AMBULATORY_CARE_PROVIDER_SITE_OTHER): Payer: 59 | Admitting: Family Medicine

## 2011-09-21 VITALS — BP 126/87 | HR 98 | Ht 67.5 in | Wt 301.0 lb

## 2011-09-21 DIAGNOSIS — R5383 Other fatigue: Secondary | ICD-10-CM | POA: Insufficient documentation

## 2011-09-21 DIAGNOSIS — R031 Nonspecific low blood-pressure reading: Secondary | ICD-10-CM

## 2011-09-21 LAB — CBC
MCH: 25.1 pg — ABNORMAL LOW (ref 26.0–34.0)
MCV: 70.9 fL — ABNORMAL LOW (ref 78.0–100.0)
Platelets: 271 10*3/uL (ref 150–400)
RDW: 15.6 % — ABNORMAL HIGH (ref 11.5–15.5)
WBC: 7.1 10*3/uL (ref 4.0–10.5)

## 2011-09-21 NOTE — Progress Notes (Signed)
  Subjective:    Patient ID: Tara Campos, female    DOB: 1965-11-30, 46 y.o.   MRN: [id number]  HPI 1. 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  Denies dizziness or feeling symptomatic from these blood pressures Patient feeling like something is wrong with her body for the past several years.  Gaining weight and feeling swollen. Lab tests done in the past, including TSH, but everything normal except for mildly low RBC.     Review of Systems Denies headache, chest pain. Reports low salt intake, regular exercise.     Objective:   Physical Exam Gen: NAD, obese Psych: appears mildly anxious, pleasant CV: RRR without m/r/g Pulm: CTAB without w/r/r Abd: non-tender, soft, obese Ext: no pitting edema but hands do appear swollen; no facial swelling    Assessment & Plan:

## 2011-09-21 NOTE — Patient Instructions (Signed)
Let's check labs today and give over them next week at your follow-up. Please schedule at the front desk on your way out.

## 2011-09-22 ENCOUNTER — Other Ambulatory Visit (HOSPITAL_COMMUNITY): Payer: Self-pay

## 2011-09-22 LAB — BASIC METABOLIC PANEL
BUN: 11 mg/dL (ref 6–23)
Creat: 0.97 mg/dL (ref 0.50–1.10)
Glucose, Bld: 86 mg/dL (ref 70–99)
Potassium: 3.9 mEq/L (ref 3.5–5.3)

## 2011-09-22 LAB — LIPID PANEL
LDL Cholesterol: 106 mg/dL — ABNORMAL HIGH (ref 0–99)
Triglycerides: 56 mg/dL (ref <150)

## 2011-09-22 LAB — BRAIN NATRIURETIC PEPTIDE: Brain Natriuretic Peptide: 17.6 pg/mL (ref 0.0–100.0)

## 2011-09-22 LAB — TSH: TSH: 3.987 u[IU]/mL (ref 0.350–4.500)

## 2011-09-22 NOTE — Assessment & Plan Note (Addendum)
Patient presents with vague complaints.  Low blood pressure reading at work place (a clinic) recently prompted her visit today. But patient seems most concerned about swelling (and she does have very mild swelling non-pitting in hands and feet) and weight gain. Apparently, she had presented multiple times for same presentation but no etiology could be pinpointed. Patient appears frustrated today. She is overweight but insists that she is gaining weight despite good lifestyle decisions (regular exercise, fairly healthy diet) and became quite irritated when I mentioned seeing Health and Wellness Coach. Will check labs today, including BMET, CBC, TSH, FLP, and BNP. No significant past medical history except for abnormal TSH in the past (07/2010), but most recent values 11/2010 including T4 and T3 were WNL.  UPDATE: all labs WNL except LDL 106 which is about where it has been for the past 5 years. Will discuss results with patient in 1 week at follow-up.

## 2011-09-28 ENCOUNTER — Encounter: Payer: Self-pay | Admitting: Family Medicine

## 2011-09-28 ENCOUNTER — Ambulatory Visit (INDEPENDENT_AMBULATORY_CARE_PROVIDER_SITE_OTHER): Payer: 59 | Admitting: Family Medicine

## 2011-09-28 VITALS — BP 117/82 | HR 105 | Temp 98.1°F | Ht 67.5 in | Wt 303.0 lb

## 2011-09-28 DIAGNOSIS — D649 Anemia, unspecified: Secondary | ICD-10-CM | POA: Insufficient documentation

## 2011-09-28 DIAGNOSIS — I10 Essential (primary) hypertension: Secondary | ICD-10-CM

## 2011-09-28 DIAGNOSIS — R609 Edema, unspecified: Secondary | ICD-10-CM

## 2011-09-28 NOTE — Patient Instructions (Addendum)
Let's check your iron panel. I will call you with the results.  Please take half of your blood pressure medication. Let me know if your blood pressure gets greater than 130 on the top consistently. Follow-up in 2 weeks.

## 2011-09-29 LAB — IRON: Iron: 52 ug/dL (ref 42–145)

## 2011-09-29 LAB — IBC PANEL: %SAT: 13 % — ABNORMAL LOW (ref 20–55)

## 2011-09-29 NOTE — Assessment & Plan Note (Addendum)
See "Edema" A/P. UPDATE: % saturation low, total iron low end of normal (50)s. Advised patient to double up on home iron from qd to bid. Will re-assess at 2-week follow-up.

## 2011-09-29 NOTE — Assessment & Plan Note (Signed)
Unchanged vague complaints. Will halve blood pressure medication today.  Discussed normal lab results except for microcytosis (although normal Hgb). Patient reports today having to have IV iron transfusions at short-stay. Patient is currently on iron therapy. Will order iron studies.  Thyroid normal. Patient willing to speak with Health and Wellness Coach.   I will call patient with iron study results and determine follow-up.

## 2011-09-29 NOTE — Progress Notes (Signed)
  Subjective:    Patient ID: Tara Campos, female    DOB: September 28, 1965, 46 y.o.   MRN: [id number]  HPI Follow-up:  Patient presented with vague complaints 02/07: low blood pressures, not being able to lose weight, "feeling like something is wrong" with her body   She brings in blood pressures today recorded twice daily. Ranges from 94-120/60-70s. One anomalous one was 122/108.  Continues to deny dizziness when her blood pressure gets low.  Review of Systems Per HPI    Objective:   Physical Exam Gen: NAD CV: tachycardic (HR 105), no m/r/g Pulm: CTAB without w/r/r Ext: non-pitting pedal edema    Assessment & Plan:

## 2011-09-29 NOTE — Assessment & Plan Note (Signed)
Normal today. Halving blood pressure medication due to concern for low blood pressures sometimes. See "Edem" A/P.

## 2011-10-12 ENCOUNTER — Encounter: Payer: Self-pay | Admitting: Family Medicine

## 2011-10-12 ENCOUNTER — Ambulatory Visit (INDEPENDENT_AMBULATORY_CARE_PROVIDER_SITE_OTHER): Payer: 59 | Admitting: Family Medicine

## 2011-10-12 VITALS — BP 125/86 | HR 87 | Temp 98.2°F | Ht 67.5 in | Wt 308.0 lb

## 2011-10-12 DIAGNOSIS — D649 Anemia, unspecified: Secondary | ICD-10-CM

## 2011-10-12 DIAGNOSIS — R51 Headache: Secondary | ICD-10-CM

## 2011-10-12 MED ORDER — METOCLOPRAMIDE HCL 5 MG PO TABS: 5.0000 mg | ORAL_TABLET | Freq: Four times a day (QID) | ORAL | Status: DC

## 2011-10-12 MED ORDER — SUMATRIPTAN SUCCINATE 50 MG PO TABS: 50.0000 mg | ORAL_TABLET | ORAL | Status: DC | PRN

## 2011-10-12 MED ORDER — DIPHENHYDRAMINE HCL 25 MG PO TABS: 25.0000 mg | ORAL_TABLET | Freq: Four times a day (QID) | ORAL | Status: DC | PRN

## 2011-10-12 NOTE — Assessment & Plan Note (Signed)
Will try IV ferric gluconate for low transferrin and since it provided relief for patient's symptoms (headache, malaise) in the past. Will schedule at short-stay. Discussed adverse reactions with patient.  Follow-up in 1-2 weeks.

## 2011-10-12 NOTE — Progress Notes (Signed)
  Subjective:    Patient ID: Tara Campos, female    DOB: 1965-11-08, 46 y.o.   MRN: [id number]  HPI Acute visit for headaches Also reports still feeling poorly, still gaining weight  Started a few days ago Throbbing right-sided headache. Not associated with photophobia or nausea/vomiting.  1200 mg Tylenol and 600 mg ibuprofen not helping.  Exacerbated by: nothing.  Uses computer most of day at work. Denies new stressors.  Headache making it difficult for her to work.   She just does not feel well. Difficult to describe.  Frustrated because every one seems to discount her symptoms to her obesity.  Tired of taking oral iron. Feels like the last time her iron was low and she received IV iron. She felt better after the IV iron.   Review of Systems Denies vision/hearing changes.     Objective:   Physical Exam Gen: NAD, obese Psych: appears tired, appropriate CV: RRR, no m/r/g Ext: non-pitting pretibial/pedal edema    Assessment & Plan:

## 2011-10-12 NOTE — Patient Instructions (Addendum)
Go to short-stay on Wednesday 10/18/2011 at 08:00 am. Go through the revolving doors at the front of the hospital and go up 1 floor (I would go to the front desk and have them direct you up).   For your headaches, try the Imitrex and Benadryl.  Let me know if your headaches persist.   Follow-up in 1 week.

## 2011-10-13 DIAGNOSIS — R519 Headache, unspecified: Secondary | ICD-10-CM | POA: Insufficient documentation

## 2011-10-13 DIAGNOSIS — R51 Headache: Secondary | ICD-10-CM | POA: Insufficient documentation

## 2011-10-13 NOTE — Assessment & Plan Note (Signed)
Atypical presentation of migraine but will try Imitrex/Benadryl to see if it helps her symptoms since OTC headache medication not helping. She reports having had migraines in the past and using triptans.

## 2011-10-17 ENCOUNTER — Other Ambulatory Visit (HOSPITAL_COMMUNITY): Payer: Self-pay | Admitting: *Deleted

## 2011-10-18 ENCOUNTER — Encounter (HOSPITAL_COMMUNITY)
Admission: RE | Admit: 2011-10-18 | Discharge: 2011-10-18 | Disposition: A | Discharge status: Home / Self Care | Payer: 59 | Source: Ambulatory Visit | Attending: Family Medicine | Admitting: Family Medicine

## 2011-10-18 DIAGNOSIS — D509 Iron deficiency anemia, unspecified: Secondary | ICD-10-CM | POA: Insufficient documentation

## 2011-10-18 MED ORDER — SODIUM CHLORIDE 0.9 % IV SOLN
125.0000 mg | INTRAVENOUS | Status: AC
  Administered 2011-10-18: 125 mg via INTRAVENOUS
  Filled 2011-10-18: qty 10

## 2011-11-02 ENCOUNTER — Other Ambulatory Visit: Payer: Self-pay | Admitting: Family Medicine

## 2011-11-02 MED ORDER — PHENTERMINE HCL 37.5 MG PO CAPS: 37.5000 mg | ORAL_CAPSULE | ORAL | Status: AC

## 2011-11-02 MED ORDER — PHENTERMINE HCL 37.5 MG PO CAPS: 37.5000 mg | ORAL_CAPSULE | ORAL | Status: DC

## 2011-11-02 NOTE — Telephone Encounter (Signed)
I was not aware of patient currently taking this medication but it had been prescribed in the past to assist with weight loss, which is currently an active goal for the patient.  I notified the patient that I will refill for 1 month's worth, but since this is a controlled substance with significant side effects, I have asked her to make an appointment with me within a month to discuss adverse effects and patient goals with this medication if she should want more refills.   I called the patient's cell phone and left a message.

## 2011-11-02 NOTE — Telephone Encounter (Signed)
Spoke with patient.  Patient informed me that this medication was discussed in August 2012 office visit, and she was correct.  The plan was to continue this medication at that time. Patient reports not using it recently, however, she would like to have it to possibly assist with weight loss.  Patient aware of possible side effects associated with this medication, notably cardiovascular sympathomimetic effects.  I have requested the pharmacy to discard previous medication request and submitted new one for 30 tablets with 2 refills.  Patient amenable to this plan. Will request patient to follow-up within 3 months if she would like more refills.

## 2011-11-17 ENCOUNTER — Ambulatory Visit: Payer: 59 | Admitting: Obstetrics & Gynecology

## 2012-01-15 ENCOUNTER — Other Ambulatory Visit (HOSPITAL_COMMUNITY): Payer: Self-pay

## 2012-01-15 ENCOUNTER — Telehealth: Payer: Self-pay | Admitting: Family Medicine

## 2012-01-15 ENCOUNTER — Other Ambulatory Visit: Payer: Self-pay | Admitting: Family Medicine

## 2012-01-15 NOTE — Telephone Encounter (Signed)
Please call Tara Campos back regarding change of provider and refills on meds.

## 2012-01-15 NOTE — Telephone Encounter (Addendum)
Returned call to patient.  Patient requesting refill of her Wellbutrin and Phentermine.  Also, requesting to change providers.  Patient states she has talked with Dr. Madolyn Frieze about this in previous office visits because she is "not comfortable with Dr. Madolyn Frieze because she is just not for me."    Patient states she feels Dr. Madolyn Frieze "does not listen to me and views me as an overweight lady complaining." Patient was seen in March 2013 for anemia and does not feel Dr. Madolyn Frieze "gave her a good plan of care."  Gaylene Brooks, RN

## 2012-01-18 NOTE — Telephone Encounter (Signed)
Discussed with Dennison Nancy, AD and will route note to Drs. Chambliss and [place], and [employer] per protocol.  Will follow up with patient after Dr. Madolyn Frieze has reviewed info and contact patient.  Gaylene Brooks, RN

## 2012-01-22 ENCOUNTER — Telehealth: Payer: Self-pay | Admitting: *Deleted

## 2012-01-22 NOTE — Telephone Encounter (Signed)
Returned call to patient and left message to call back.  Quinne Pires Ann, RN  

## 2012-01-22 NOTE — Telephone Encounter (Signed)
Message copied by Jose Persia on Mon Jan 22, 2012  2:10 PM ------      Message from: Spero Geralds E      Created: Mon Jan 22, 2012  1:37 PM      Regarding: Provider change protocol       I spoke with Deklynn about this patient.  Based on our conversation, it seems reasonable that the switch be made and that [facility] follow up with the patient.  I am supportive of Krissa not having to call the patient in order to provide closure unless, of course, the patient expresses a wish to speak to Chisholm personally.            Please feel free to see me if you have concerns about this approach.            Thanks,      Marcelino Duster

## 2012-01-23 ENCOUNTER — Other Ambulatory Visit: Payer: Self-pay | Admitting: Family Medicine

## 2012-01-24 NOTE — Telephone Encounter (Signed)
Will call in 1 month's worth.  Patient will need to see a provider if she wants any more refills. I am no longer this patient's provider. Called pharmacy and notified them of this message.

## 2012-01-24 NOTE — Telephone Encounter (Signed)
Called patient and left message to call our office back.  Nolah Krenzer Ann, RN  

## 2012-01-31 NOTE — Telephone Encounter (Signed)
Spoke with patient and informed of PCP change to Dr. Armen Pickup.  Appt made with Dr. Armen Pickup for 02/21/12 at 8:45 to f/u on anemia and BP.  Gaylene Brooks, RN

## 2012-02-21 ENCOUNTER — Ambulatory Visit: Payer: 59 | Admitting: Family Medicine

## 2012-02-22 ENCOUNTER — Encounter: Payer: Self-pay | Admitting: Family Medicine

## 2012-02-22 ENCOUNTER — Ambulatory Visit (INDEPENDENT_AMBULATORY_CARE_PROVIDER_SITE_OTHER): Payer: 59 | Admitting: Family Medicine

## 2012-02-22 VITALS — BP 109/58 | HR 93 | Temp 98.7°F | Ht 67.0 in | Wt 321.0 lb

## 2012-02-22 DIAGNOSIS — D509 Iron deficiency anemia, unspecified: Secondary | ICD-10-CM

## 2012-02-22 DIAGNOSIS — I10 Essential (primary) hypertension: Secondary | ICD-10-CM

## 2012-02-22 DIAGNOSIS — D649 Anemia, unspecified: Secondary | ICD-10-CM

## 2012-02-22 DIAGNOSIS — E669 Obesity, unspecified: Secondary | ICD-10-CM

## 2012-02-22 LAB — CBC
HCT: 32.8 % — ABNORMAL LOW (ref 36.0–46.0)
Hemoglobin: 11.7 g/dL — ABNORMAL LOW (ref 12.0–15.0)
MCH: 24.4 pg — ABNORMAL LOW (ref 26.0–34.0)
MCV: 68.5 fL — ABNORMAL LOW (ref 78.0–100.0)
Platelets: 279 10*3/uL (ref 150–400)
RBC: 4.79 MIL/uL (ref 3.87–5.11)
WBC: 6.7 10*3/uL (ref 4.0–10.5)

## 2012-02-22 MED ORDER — HYDROCHLOROTHIAZIDE 12.5 MG PO CAPS: 12.5000 mg | ORAL_CAPSULE | Freq: Every day | ORAL | Status: DC

## 2012-02-22 NOTE — Patient Instructions (Addendum)
Mrs. Mceachern,  Thank you for coming in today.  For HTN: -stop prinzide -start HCTZ 1/2 tab daily  For weight management: Please schedule an appt with me and Wyona Almas on Thursday AM Nutrition clinic on August 25th.   Dr. Armen Pickup

## 2012-02-23 ENCOUNTER — Telehealth: Payer: Self-pay | Admitting: Family Medicine

## 2012-02-23 ENCOUNTER — Other Ambulatory Visit: Payer: Self-pay | Admitting: Family Medicine

## 2012-02-23 MED ORDER — HYDROCHLOROTHIAZIDE 12.5 MG PO TABS: 12.5000 mg | ORAL_TABLET | Freq: Every day | ORAL | Status: DC

## 2012-02-23 MED ORDER — HYDROCHLOROTHIAZIDE 12.5 MG PO CAPS: ORAL_CAPSULE | ORAL | Status: DC

## 2012-02-23 MED ORDER — HYDROCHLOROTHIAZIDE 12.5 MG PO TABS: 6.2500 mg | ORAL_TABLET | Freq: Every day | ORAL | Status: DC

## 2012-02-23 NOTE — Telephone Encounter (Signed)
Called pt left VM. Cannot split HCTZ so take the whole 12.5 mg capsule daily. Pick up at earliest convenience.

## 2012-02-23 NOTE — Assessment & Plan Note (Signed)
A: improved. No symptoms. Hgb stable.  P: -continue oral iron therapy should take with Vit C containing foods.  -she may need another dose of IV iron if symptomatic.

## 2012-02-23 NOTE — Assessment & Plan Note (Signed)
A: low BP. Asymptomatic. P:  -decrease BP med to HCTZ only 6.25 mg daily.

## 2012-02-23 NOTE — Progress Notes (Signed)
Subjective:     Patient ID: Tara Campos, female   DOB: [birth year], 46 y.o.   MRN: [id number]  HPI 46 yo F presents to meet new MD and discuss the following:  1. Anemia: doing well. No fatigue or headache. Taking iron. No constipation. Had IV iron at short stay in March.  2. Obesity: gaining weight. Feels swelling sometimes. Would like to address in detail at nutrition clinic.  3. HTN: history of HTN now low BP. No CP or palpitation. No syncope.   Review of Systems As per HPI  Objective:   Physical Exam BP 109/58  Pulse 93  Temp 98.7 F (37.1 C) (Oral)  Ht 5\' 7"  (1.702 m)  Wt 321 lb (145.605 kg)  BMI 50.28 kg/m2 General appearance: alert, cooperative, no distress and morbidly obese Eyes: conjunctivae/corneas clear. PERRL, EOM's intact.  Throat: lips, mucosa, and tongue normal; teeth and gums normal Lungs: clear to auscultation bilaterally Heart: regular rate and rhythm, S1, S2 normal, no murmur, click, rub or gallop Abdomen: soft, non-tender; bowel sounds normal; no masses,  no organomegaly Extremities: extremities normal, atraumatic, no cyanosis or edema Pulses: 2+ and symmetric Neurologic: Grossly normal  Assessment and Plan:

## 2012-02-23 NOTE — Assessment & Plan Note (Signed)
P: rtrefer to nutrition clinic with me for further evaluation.

## 2012-03-07 ENCOUNTER — Ambulatory Visit: Payer: 59 | Admitting: Family Medicine

## 2012-03-07 ENCOUNTER — Encounter: Payer: Self-pay | Admitting: Family Medicine

## 2012-03-07 NOTE — Progress Notes (Signed)
Subjective:     Patient ID: Tara Campos, female   DOB: 1966/03/15, 46 y.o.   MRN: [id number]  HPI Medical History:  1. Overweight: since childhood.  2. HTN: since 2003. Related to stress of taking care of father and nieces/nephews when her mother passed away. Compliant with HCTZ.  3. Elevated TSH in the past without diagnosis/treatment for hypothyroidism.   Diet History:  Eating Pattern:  Meals: eats 2 meals per day .Often skips dinner or has ice cream for dinner. Has 3 daily meals 3x/week. Daytime snacks: some for of dessert.  Nighttime Snacks: yes, ice cream for dinner. 4 days per week. 1 cup.   24 hr Recall:  Breakfast (9 AM): coffee (w/ equal) and greek yogurt (6 oz).   Lunch/Dinner (12:30 PM): chicken drummettes (6) , watermelon, celery, cherry tomatoes, ranch dressing (1/2 tsp). Sprite 1/2 can.  Snack (3 AM ): apple caramel pound cake 1 inch thick slice. Coffee with equal and water.  Dinner/Supper (): no dinner    Everyday foods/drinks:  [place] yogurt, coffee, ice cream  Eat out pattern: Does not like to spend money on eating out.    Exercise: 5 x per week (30-40 minutes walking on treadmill and using weights). Started 5 months ago.  Wt Readings from Last 3 Encounters:  03/07/12 315 lb 4.8 oz (143.019 kg)  02/22/12 321 lb (145.605 kg)  10/18/11 308 lb (139.708 kg)    Social: lives with dad and youngest son (47). Son will be off to college August 19th.   Recommendations/Goals:   1. Substitute your afternoon snack with fruit.  2. Eat 3 meals every day. Plan ahead on unusually busy days.  3. Incorporate activity into your work time:  -1 minute every 20 minutes: sits and stands -5 minutes every 60 minutes: walking around the office, sits and stands  4. Remove ice cream from freezer: set a dessert goal to x # of times per week.  5. Consider weight management class at [facility] 5:30-6:30 on Tuesdays for 6 weeks. [email or web address] to register.   Review of  Systems     Physical Exam

## 2012-03-07 NOTE — Patient Instructions (Addendum)
Kenedy,  Thank you for coming in today.   Recommendations/Goals:   1. Substitute your afternoon snack with fruit.  2. Eat 3 meals every day. Plan ahead on unusually busy days.  3. Incorporate activity into your work time:  -1 minute every 20 minutes: sits and stands -5 minute every 60 minutes: walking around the offices, sits and stands  4. Remove ice cream from freezer: set a dessert goal to x # of times per week.  5. Consider weight management class at [facility] 5:30-6:30 on Tuesdays for 6 weeks. [email or web address] to register.   F/u 4-6 weeks.   Dr. Armen Pickup

## 2012-04-12 ENCOUNTER — Ambulatory Visit (INDEPENDENT_AMBULATORY_CARE_PROVIDER_SITE_OTHER): Payer: 59 | Admitting: Family Medicine

## 2012-04-12 ENCOUNTER — Encounter: Payer: Self-pay | Admitting: Family Medicine

## 2012-04-12 VITALS — BP 129/88 | HR 86 | Temp 97.3°F | Ht 67.0 in | Wt 318.0 lb

## 2012-04-12 DIAGNOSIS — E669 Obesity, unspecified: Secondary | ICD-10-CM

## 2012-04-12 DIAGNOSIS — D649 Anemia, unspecified: Secondary | ICD-10-CM

## 2012-04-12 DIAGNOSIS — G43909 Migraine, unspecified, not intractable, without status migrainosus: Secondary | ICD-10-CM

## 2012-04-12 DIAGNOSIS — I10 Essential (primary) hypertension: Secondary | ICD-10-CM

## 2012-04-12 LAB — CBC
HCT: 34.3 % — ABNORMAL LOW (ref 36.0–46.0)
Hemoglobin: 12.3 g/dL (ref 12.0–15.0)
MCHC: 35.9 g/dL (ref 30.0–36.0)
MCV: 68.7 fL — ABNORMAL LOW (ref 78.0–100.0)
RDW: 15.5 % (ref 11.5–15.5)

## 2012-04-12 MED ORDER — SUMATRIPTAN SUCCINATE 50 MG PO TABS: ORAL_TABLET | ORAL | Status: DC

## 2012-04-12 NOTE — Patient Instructions (Addendum)
Tara Campos,  Thank you for coming in today. I will check you CBC and TSH today and call or send letter with results.   Continue your weight loss plan. I am excited that you will be participating in Tara Campos class. I hope you enjoy it.   Recommendations/Goals:   1. Substitute your afternoon snack with fruit.  2. Eat 3 meals every day. Plan ahead on unusually busy days.  3. Incorporate activity into your work time:  -1 minute every 20 minutes: sits and stands -5 minute every 60 minutes: walking around the offices, sits and stands  4. Remove ice cream from freezer: set a dessert goal to x # of times per week.     F/u in 2 months may be sooner based on labs.   Dr. Armen Pickup

## 2012-04-12 NOTE — Assessment & Plan Note (Signed)
Imitrex effective. Refill imitrex with instructions to not treat more than two headaches per month.

## 2012-04-12 NOTE — Progress Notes (Signed)
Subjective:     Patient ID: Tara Campos, female   DOB: 07/21/1966, 46 y.o.   MRN: [id number]  HPI 46 yo F presents for f/u visit to discuss the following:  1. Obesity: patient has been working a previously set weight loss goals. Reports doing/feeling great prior to this week. Now feels bloated and fatigued. Is eating breakfast daily per goals. Is not eating ice cream. Had it last night but had not had any prior to that.   2. Anemia: taking iron. Decline in energy level. Feeling bloated. Denies syncope/presyncope, chest pain and SOB. LMP 10/2010 following endometrial ablation.   3. HTN: compliant with HCTZ. Denies CP, SOB. Feels bloated.   4. Migraines: no headache today. Had migraine yesterday. Did not have Imitrex to take. Reports having fewer than two severe migraines per week.   Review of Systems As per HPI     Objective:   Physical Exam BP 129/88  Pulse 86  Temp 97.3 F (36.3 C) (Oral)  Ht 5\' 7"  (1.702 m)  Wt 318 lb (144.244 kg)  BMI 49.81 kg/m2 Wt Readings from Last 3 Encounters:  04/12/12 318 lb (144.244 kg)  03/07/12 315 lb 4.8 oz (143.019 kg)  02/22/12 321 lb (145.605 kg)   General appearance: alert, cooperative and no distress Lungs: clear to auscultation bilaterally Heart: regular rate and rhythm, S1, S2 normal, no murmur, click, rub or gallop Extremities: extremities normal, atraumatic, no cyanosis. Trace LE edema     Assessment and Plan:

## 2012-04-12 NOTE — Assessment & Plan Note (Signed)
A: declined. Weight gain. Reports compliance with weight loss recommendations.  P: Check TSH (mother and sister with hypothyroidism) and CBC. Encourage continued efforts as patient admits to feeling great prior to this past week.  patient has signed up for [employer] weight loss class starting next month.

## 2012-04-12 NOTE — Assessment & Plan Note (Signed)
A: BP at goal.  P: continue HCTZ.

## 2012-04-12 NOTE — Assessment & Plan Note (Signed)
A: patient fatigue and bloated feeling. Trace LE edema. Otherwise asymptomatic. Compliant with oral iron.  P: Check CBC today.

## 2012-05-01 ENCOUNTER — Other Ambulatory Visit: Payer: Self-pay | Admitting: *Deleted

## 2012-05-06 MED ORDER — SUMATRIPTAN SUCCINATE 50 MG PO TABS: ORAL_TABLET | ORAL | Status: DC

## 2012-06-11 ENCOUNTER — Ambulatory Visit (INDEPENDENT_AMBULATORY_CARE_PROVIDER_SITE_OTHER): Payer: 59 | Admitting: Family Medicine

## 2012-06-11 ENCOUNTER — Encounter: Payer: Self-pay | Admitting: Family Medicine

## 2012-06-11 VITALS — BP 129/87 | HR 87 | Temp 98.7°F | Ht 67.0 in | Wt 322.0 lb

## 2012-06-11 DIAGNOSIS — E669 Obesity, unspecified: Secondary | ICD-10-CM

## 2012-06-11 DIAGNOSIS — Z87891 Personal history of nicotine dependence: Secondary | ICD-10-CM

## 2012-06-11 DIAGNOSIS — F17201 Nicotine dependence, unspecified, in remission: Secondary | ICD-10-CM

## 2012-06-11 DIAGNOSIS — I10 Essential (primary) hypertension: Secondary | ICD-10-CM

## 2012-06-11 NOTE — Progress Notes (Signed)
Subjective:     Patient ID: Tara Campos, female   DOB: [birth year], 46 y.o.   MRN: [id number]  HPI 46 yo F presents for f/u to discuss the following:  1. Obesity: at a plateau. Not eating ice cream. Moderate exercise. Has been evaluated for weight  loss surgery in the past but did not complete process due to illness per report.   2. Weaning off Wellbutrin: on it for smoking cessation.  Has not smoked since 2012. Slow wean. Down to every other day. Denies mood changes.   3. HTN: off HCTZ x one week. BPs at home 120s/70s-80s. Denies HA, CP, SOB, LE edema.   Review of Systems As per HPI     Objective:   Physical Exam BP 129/87  Pulse 87  Temp 98.7 F (37.1 C) (Oral)  Ht 5\' 7"  (1.702 m)  Wt 322 lb (146.058 kg)  BMI 50.43 kg/m2 Wt Readings from Last 3 Encounters:  06/11/12 322 lb (146.058 kg)  04/12/12 318 lb (144.244 kg)  03/07/12 315 lb 4.8 oz (143.019 kg)  General appearance: alert, cooperative and no distress, obese Lungs: clear to auscultation bilaterally Heart: regular rate and rhythm, S1, S2 normal, no murmur, click, rub or gallop Extremities: extremities normal, atraumatic, no cyanosis or edema     Assessment and Plan:

## 2012-06-11 NOTE — Patient Instructions (Addendum)
Ms. Norell,  Thank you for coming in to see me today.   I agree with weaning off the Wellbutrin.   Here is a schedule: Every 2 days for one more week Every 3 days for two weeks.  Then stop.   BP well controlled.  Please continue to check BP at home. If elevated please call me.   Regarding weight loss, keep up your efforts with healthy diet and exercise. Consider weight watchers with close monitoring of your hemoglobin. Weight loss surgery is an option. I recommend going through [facility] [place] [facility] is you are interested. Let me know.   Schedule screening mammogram before the end of the year.   Dr. Armen Pickup

## 2012-06-13 NOTE — Assessment & Plan Note (Signed)
A: well controlled of HCTZ. P: Continue to not take HCTZ Continue weight loss efforts Home monitoring Schedule visit if BP elevated

## 2012-06-13 NOTE — Assessment & Plan Note (Addendum)
A: no tobacco since 2012. P: wean wellbutrin  Here is a schedule: Every 2 days for one more week Every 3 days for two weeks.  Then stop.

## 2012-06-13 NOTE — Assessment & Plan Note (Signed)
A: at a plateau. Based on BMI would be a candidate for weight loss surgery.  P: keep up your efforts with healthy diet and exercise. Consider weight watchers with close monitoring of your hemoglobin. Weight loss surgery is an option. I recommend going through [facility] [place] [facility] is you are interested. Let me know.

## 2012-08-23 ENCOUNTER — Other Ambulatory Visit: Payer: Self-pay | Admitting: *Deleted

## 2012-08-23 MED ORDER — SUMATRIPTAN SUCCINATE 50 MG PO TABS: ORAL_TABLET | ORAL | Status: DC

## 2012-12-30 ENCOUNTER — Encounter: Payer: Self-pay | Admitting: Family Medicine

## 2013-01-22 ENCOUNTER — Other Ambulatory Visit: Payer: Self-pay | Admitting: Obstetrics & Gynecology

## 2013-01-22 DIAGNOSIS — Z1231 Encounter for screening mammogram for malignant neoplasm of breast: Secondary | ICD-10-CM

## 2013-01-23 ENCOUNTER — Ambulatory Visit (HOSPITAL_COMMUNITY)
Admission: RE | Admit: 2013-01-23 | Discharge: 2013-01-23 | Disposition: A | Discharge status: Home / Self Care | Payer: 59 | Source: Ambulatory Visit | Attending: Obstetrics & Gynecology | Admitting: Obstetrics & Gynecology

## 2013-01-23 DIAGNOSIS — Z1231 Encounter for screening mammogram for malignant neoplasm of breast: Secondary | ICD-10-CM | POA: Insufficient documentation

## 2013-01-30 ENCOUNTER — Ambulatory Visit (INDEPENDENT_AMBULATORY_CARE_PROVIDER_SITE_OTHER): Payer: 59 | Admitting: Obstetrics & Gynecology

## 2013-01-30 ENCOUNTER — Encounter: Payer: Self-pay | Admitting: Obstetrics & Gynecology

## 2013-01-30 VITALS — BP 133/101 | HR 80 | Resp 16 | Ht 67.5 in | Wt 325.0 lb

## 2013-01-30 DIAGNOSIS — B372 Candidiasis of skin and nail: Secondary | ICD-10-CM

## 2013-01-30 DIAGNOSIS — Z01419 Encounter for gynecological examination (general) (routine) without abnormal findings: Secondary | ICD-10-CM

## 2013-01-30 DIAGNOSIS — Z1151 Encounter for screening for human papillomavirus (HPV): Secondary | ICD-10-CM

## 2013-01-30 DIAGNOSIS — I10 Essential (primary) hypertension: Secondary | ICD-10-CM

## 2013-01-30 DIAGNOSIS — G43909 Migraine, unspecified, not intractable, without status migrainosus: Secondary | ICD-10-CM

## 2013-01-30 DIAGNOSIS — Z124 Encounter for screening for malignant neoplasm of cervix: Secondary | ICD-10-CM

## 2013-01-30 MED ORDER — NYSTATIN 100000 UNIT/GM EX POWD: Freq: Three times a day (TID) | CUTANEOUS | Status: DC

## 2013-01-30 NOTE — Progress Notes (Signed)
  Subjective:     Tara Campos is a 47 y.o. G8P3003 female and is here for a comprehensive GYN physical exam. The patient reports no GYN problems.  She is concerned about her elevated BP today and associated headaches.  No visual changes or other concerns. Used to be on antihypertensive but she stopped taking the medication.  History   Social History  . Marital Status: Divorced    Spouse Name: N/A    Number of Children: N/A  . Years of Education: N/A   Occupational History  . Not on file.   Social History Main Topics  . Smoking status: Former Smoker    Quit date: 11/29/2010  . Smokeless tobacco: Never Used  . Alcohol Use: No  . Drug Use: No  . Sexually Active: Not on file   Other Topics Concern  . 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 Influenza Vaccine  04/14/2013  . Pap Smear  10/12/2013  . Tetanus/tdap  12/12/2013    The following portions of the patient's history were reviewed and updated as appropriate: allergies, current medications, past family history, past medical history, past social history, past surgical history and problem list.  Review of Systems Pertinent items are noted in HPI.   Objective:  BP 133/101  Pulse 80  Resp 16  Ht 5' 7.5" (1.715 m)  Wt 325 lb (147.419 kg)  BMI 50.12 kg/m2 GENERAL: Well-developed, obese female in no acute distress.  HEENT: Normocephalic, atraumatic. Sclerae anicteric.  NECK: Supple. Normal thyroid.  LUNGS: Clear to auscultation bilaterally.  HEART: Regular rate and rhythm.  BREASTS: Large, symmetric in size. No masses, skin changes, nipple drainage, or lymphadenopathy.  ABDOMEN: Soft, obese, nontender, nondistended. No organomegaly palpated. Under her abdomen, in the juncture of her abdomen and thighs anteriorly, there is a wide area of tenderness and erythema  extending from left ASIS to the right one. PELVIC: Normal external female genitalia. Vagina is pink and rugated. Normal discharge. Normal cervix contour. Pap smear obtained. Unable to palpate uterus or adnexa secondary to obese habitus  EXTREMITIES: No cyanosis, clubbing, or edema, 2+ distal pulses.    Assessment:    Healthy female exam. Candidal infection of the skin   HTN with associated headaches  Plan:   Pap done, will follow up results and manage accordingly. Nystatin powder prescribed for skin infection; advised to keep area clean and dry Follow up with PCP for HTN and headaches Routine preventative health maintenance measures emphasized

## 2013-01-30 NOTE — Patient Instructions (Signed)
Preventive Care for Adults, Female A healthy lifestyle and preventive care can promote health and wellness. Preventive health guidelines for women include the following key practices.  A routine yearly physical is a good way to check with your caregiver about your health and preventive screening. It is a chance to share any concerns and updates on your health, and to receive a thorough exam.  Visit your dentist for a routine exam and preventive care every 6 months. Brush your teeth twice a day and floss once a day. Good oral hygiene prevents tooth decay and gum disease.  The frequency of eye exams is based on your age, health, family medical history, use of contact lenses, and other factors. Follow your caregiver's recommendations for frequency of eye exams.  Eat a healthy diet. Foods like vegetables, fruits, whole grains, low-fat dairy products, and lean protein foods contain the nutrients you need without too many calories. Decrease your intake of foods high in solid fats, added sugars, and salt. Eat the right amount of calories for you.Get information about a proper diet from your caregiver, if necessary.  Regular physical exercise is one of the most important things you can do for your health. Most adults should get at least 150 minutes of moderate-intensity exercise (any activity that increases your heart rate and causes you to sweat) each week. In addition, most adults need muscle-strengthening exercises on 2 or more days a week.  Maintain a healthy weight. The body mass index (BMI) is a screening tool to identify possible weight problems. It provides an estimate of body fat based on height and weight. Your caregiver can help determine your BMI, and can help you achieve or maintain a healthy weight.For adults 20 years and older:  A BMI below 18.5 is considered underweight.  A BMI of 18.5 to 24.9 is normal.  A BMI of 25 to 29.9 is considered overweight.  A BMI of 30 and above is  considered obese.  Maintain normal blood lipids and cholesterol levels by exercising and minimizing your intake of saturated fat. Eat a balanced diet with plenty of fruit and vegetables. Blood tests for lipids and cholesterol should begin at age 20 and be repeated every 5 years. If your lipid or cholesterol levels are high, you are over 50, or you are at high risk for heart disease, you may need your cholesterol levels checked more frequently.Ongoing high lipid and cholesterol levels should be treated with medicines if diet and exercise are not effective.  If you smoke, find out from your caregiver how to quit. If you do not use tobacco, do not start.  If you are pregnant, do not drink alcohol. If you are breastfeeding, be very cautious about drinking alcohol. If you are not pregnant and choose to drink alcohol, do not exceed 1 drink per day. One drink is considered to be 12 ounces (355 mL) of beer, 5 ounces (148 mL) of wine, or 1.5 ounces (44 mL) of liquor.  Avoid use of street drugs. Do not share needles with anyone. Ask for help if you need support or instructions about stopping the use of drugs.  High blood pressure causes heart disease and increases the risk of stroke. Your blood pressure should be checked at least every 1 to 2 years. Ongoing high blood pressure should be treated with medicines if weight loss and exercise are not effective.  If you are 55 to 47 years old, ask your caregiver if you should take aspirin to prevent strokes.  Diabetes   screening involves taking a blood sample to check your fasting blood sugar level. This should be done once every 3 years, after age 45, if you are within normal weight and without risk factors for diabetes. Testing should be considered at a younger age or be carried out more frequently if you are overweight and have at least 1 risk factor for diabetes.  Breast cancer screening is essential preventive care for women. You should practice "breast  self-awareness." This means understanding the normal appearance and feel of your breasts and may include breast self-examination. Any changes detected, no matter how small, should be reported to a caregiver. Women in their 20s and 30s should have a clinical breast exam (CBE) by a caregiver as part of a regular health exam every 1 to 3 years. After age 40, women should have a CBE every year. Starting at age 40, women should consider having a mammography (breast X-ray test) every year. Women who have a family history of breast cancer should talk to their caregiver about genetic screening. Women at a high risk of breast cancer should talk to their caregivers about having magnetic resonance imaging (MRI) and a mammography every year.  The Pap test is a screening test for cervical cancer. A Pap test can show cell changes on the cervix that might become cervical cancer if left untreated. A Pap test is a procedure in which cells are obtained and examined from the lower end of the uterus (cervix).  Women should have a Pap test starting at age 21.  Between ages 21 and 29, Pap tests should be repeated every 2 years.  Beginning at age 30, you should have a Pap test every 3 years as long as the past 3 Pap tests have been normal.  Some women have medical problems that increase the chance of getting cervical cancer. Talk to your caregiver about these problems. It is especially important to talk to your caregiver if a new problem develops soon after your last Pap test. In these cases, your caregiver may recommend more frequent screening and Pap tests.  The above recommendations are the same for women who have or have not gotten the vaccine for human papillomavirus (HPV).  If you had a hysterectomy for a problem that was not cancer or a condition that could lead to cancer, then you no longer need Pap tests. Even if you no longer need a Pap test, a regular exam is a good idea to make sure no other problems are  starting.  If you are between ages 65 and 70, and you have had normal Pap tests going back 10 years, you no longer need Pap tests. Even if you no longer need a Pap test, a regular exam is a good idea to make sure no other problems are starting.  If you have had past treatment for cervical cancer or a condition that could lead to cancer, you need Pap tests and screening for cancer for at least 20 years after your treatment.  If Pap tests have been discontinued, risk factors (such as a new sexual partner) need to be reassessed to determine if screening should be resumed.  The HPV test is an additional test that may be used for cervical cancer screening. The HPV test looks for the virus that can cause the cell changes on the cervix. The cells collected during the Pap test can be tested for HPV. The HPV test could be used to screen women aged 30 years and older, and should   be used in women of any age who have unclear Pap test results. After the age of 30, women should have HPV testing at the same frequency as a Pap test.  Colorectal cancer can be detected and often prevented. Most routine colorectal cancer screening begins at the age of 50 and continues through age 75. However, your caregiver may recommend screening at an earlier age if you have risk factors for colon cancer. On a yearly basis, your caregiver may provide home test kits to check for hidden blood in the stool. Use of a small camera at the end of a tube, to directly examine the colon (sigmoidoscopy or colonoscopy), can detect the earliest forms of colorectal cancer. Talk to your caregiver about this at age 50, when routine screening begins. Direct examination of the colon should be repeated every 5 to 10 years through age 75, unless early forms of pre-cancerous polyps or small growths are found.  Hepatitis C blood testing is recommended for all people born from 1945 through 1965 and any individual with known risks for hepatitis C.  Practice  safe sex. Use condoms and avoid high-risk sexual practices to reduce the spread of sexually transmitted infections (STIs). STIs include gonorrhea, chlamydia, syphilis, trichomonas, herpes, HPV, and human immunodeficiency virus (HIV). Herpes, HIV, and HPV are viral illnesses that have no cure. They can result in disability, cancer, and death. Sexually active women aged 25 and younger should be checked for chlamydia. Older women with new or multiple partners should also be tested for chlamydia. Testing for other STIs is recommended if you are sexually active and at increased risk.  Osteoporosis is a disease in which the bones lose minerals and strength with aging. This can result in serious bone fractures. The risk of osteoporosis can be identified using a bone density scan. Women ages 65 and over and women at risk for fractures or osteoporosis should discuss screening with their caregivers. Ask your caregiver whether you should take a calcium supplement or vitamin D to reduce the rate of osteoporosis.  Menopause can be associated with physical symptoms and risks. Hormone replacement therapy is available to decrease symptoms and risks. You should talk to your caregiver about whether hormone replacement therapy is right for you.  Use sunscreen with sun protection factor (SPF) of 30 or more. Apply sunscreen liberally and repeatedly throughout the day. You should seek shade when your shadow is shorter than you. Protect yourself by wearing long sleeves, pants, a wide-brimmed hat, and sunglasses year round, whenever you are outdoors.  Once a month, do a whole body skin exam, using a mirror to look at the skin on your back. Notify your caregiver of new moles, moles that have irregular borders, moles that are larger than a pencil eraser, or moles that have changed in shape or color.  Stay current with required immunizations.  Influenza. You need a dose every fall (or winter). The composition of the flu vaccine  changes each year, so being vaccinated once is not enough.  Pneumococcal polysaccharide. You need 1 to 2 doses if you smoke cigarettes or if you have certain chronic medical conditions. You need 1 dose at age 65 (or older) if you have never been vaccinated.  Tetanus, diphtheria, pertussis (Tdap, Td). Get 1 dose of Tdap vaccine if you are younger than age 65, are over 65 and have contact with an infant, are a healthcare worker, are pregnant, or simply want to be protected from whooping cough. After that, you need a Td   booster dose every 10 years. Consult your caregiver if you have not had at least 3 tetanus and diphtheria-containing shots sometime in your life or have a deep or dirty wound.  HPV. You need this vaccine if you are a woman age 26 or younger. The vaccine is given in 3 doses over 6 months.  Measles, mumps, rubella (MMR). You need at least 1 dose of MMR if you were born in 1957 or later. You may also need a second dose.  Meningococcal. If you are age 19 to 21 and a first-year college student living in a residence hall, or have one of several medical conditions, you need to get vaccinated against meningococcal disease. You may also need additional booster doses.  Zoster (shingles). If you are age 60 or older, you should get this vaccine.  Varicella (chickenpox). If you have never had chickenpox or you were vaccinated but received only 1 dose, talk to your caregiver to find out if you need this vaccine.  Hepatitis A. You need this vaccine if you have a specific risk factor for hepatitis A virus infection or you simply wish to be protected from this disease. The vaccine is usually given as 2 doses, 6 to 18 months apart.  Hepatitis B. You need this vaccine if you have a specific risk factor for hepatitis B virus infection or you simply wish to be protected from this disease. The vaccine is given in 3 doses, usually over 6 months. Preventive Services / Frequency Ages 19 to 39  Blood  pressure check.** / Every 1 to 2 years.  Lipid and cholesterol check.** / Every 5 years beginning at age 20.  Clinical breast exam.** / Every 3 years for women in their 20s and 30s.  Pap test.** / Every 2 years from ages 21 through 29. Every 3 years starting at age 30 through age 65 or 70 with a history of 3 consecutive normal Pap tests.  HPV screening.** / Every 3 years from ages 30 through ages 65 to 70 with a history of 3 consecutive normal Pap tests.  Hepatitis C blood test.** / For any individual with known risks for hepatitis C.  Skin self-exam. / Monthly.  Influenza immunization.** / Every year.  Pneumococcal polysaccharide immunization.** / 1 to 2 doses if you smoke cigarettes or if you have certain chronic medical conditions.  Tetanus, diphtheria, pertussis (Tdap, Td) immunization. / A one-time dose of Tdap vaccine. After that, you need a Td booster dose every 10 years.  HPV immunization. / 3 doses over 6 months, if you are 26 and younger.  Measles, mumps, rubella (MMR) immunization. / You need at least 1 dose of MMR if you were born in 1957 or later. You may also need a second dose.  Meningococcal immunization. / 1 dose if you are age 19 to 21 and a first-year college student living in a residence hall, or have one of several medical conditions, you need to get vaccinated against meningococcal disease. You may also need additional booster doses.  Varicella immunization.** / Consult your caregiver.  Hepatitis A immunization.** / Consult your caregiver. 2 doses, 6 to 18 months apart.  Hepatitis B immunization.** / Consult your caregiver. 3 doses usually over 6 months. Ages 40 to 64  Blood pressure check.** / Every 1 to 2 years.  Lipid and cholesterol check.** / Every 5 years beginning at age 20.  Clinical breast exam.** / Every year after age 40.  Mammogram.** / Every year beginning at age 40   and continuing for as long as you are in good health. Consult with your  caregiver.  Pap test.** / Every 3 years starting at age 30 through age 65 or 70 with a history of 3 consecutive normal Pap tests.  HPV screening.** / Every 3 years from ages 30 through ages 65 to 70 with a history of 3 consecutive normal Pap tests.  Fecal occult blood test (FOBT) of stool. / Every year beginning at age 50 and continuing until age 75. You may not need to do this test if you get a colonoscopy every 10 years.  Flexible sigmoidoscopy or colonoscopy.** / Every 5 years for a flexible sigmoidoscopy or every 10 years for a colonoscopy beginning at age 50 and continuing until age 75.  Hepatitis C blood test.** / For all people born from 1945 through 1965 and any individual with known risks for hepatitis C.  Skin self-exam. / Monthly.  Influenza immunization.** / Every year.  Pneumococcal polysaccharide immunization.** / 1 to 2 doses if you smoke cigarettes or if you have certain chronic medical conditions.  Tetanus, diphtheria, pertussis (Tdap, Td) immunization.** / A one-time dose of Tdap vaccine. After that, you need a Td booster dose every 10 years.  Measles, mumps, rubella (MMR) immunization. / You need at least 1 dose of MMR if you were born in 1957 or later. You may also need a second dose.  Varicella immunization.** / Consult your caregiver.  Meningococcal immunization.** / Consult your caregiver.  Hepatitis A immunization.** / Consult your caregiver. 2 doses, 6 to 18 months apart.  Hepatitis B immunization.** / Consult your caregiver. 3 doses, usually over 6 months. Ages 65 and over  Blood pressure check.** / Every 1 to 2 years.  Lipid and cholesterol check.** / Every 5 years beginning at age 20.  Clinical breast exam.** / Every year after age 40.  Mammogram.** / Every year beginning at age 40 and continuing for as long as you are in good health. Consult with your caregiver.  Pap test.** / Every 3 years starting at age 30 through age 65 or 70 with a 3  consecutive normal Pap tests. Testing can be stopped between 65 and 70 with 3 consecutive normal Pap tests and no abnormal Pap or HPV tests in the past 10 years.  HPV screening.** / Every 3 years from ages 30 through ages 65 or 70 with a history of 3 consecutive normal Pap tests. Testing can be stopped between 65 and 70 with 3 consecutive normal Pap tests and no abnormal Pap or HPV tests in the past 10 years.  Fecal occult blood test (FOBT) of stool. / Every year beginning at age 50 and continuing until age 75. You may not need to do this test if you get a colonoscopy every 10 years.  Flexible sigmoidoscopy or colonoscopy.** / Every 5 years for a flexible sigmoidoscopy or every 10 years for a colonoscopy beginning at age 50 and continuing until age 75.  Hepatitis C blood test.** / For all people born from 1945 through 1965 and any individual with known risks for hepatitis C.  Osteoporosis screening.** / A one-time screening for women ages 65 and over and women at risk for fractures or osteoporosis.  Skin self-exam. / Monthly.  Influenza immunization.** / Every year.  Pneumococcal polysaccharide immunization.** / 1 dose at age 65 (or older) if you have never been vaccinated.  Tetanus, diphtheria, pertussis (Tdap, Td) immunization. / A one-time dose of Tdap vaccine if you are over   65 and have contact with an infant, are a healthcare worker, or simply want to be protected from whooping cough. After that, you need a Td booster dose every 10 years.  Varicella immunization.** / Consult your caregiver.  Meningococcal immunization.** / Consult your caregiver.  Hepatitis A immunization.** / Consult your caregiver. 2 doses, 6 to 18 months apart.  Hepatitis B immunization.** / Check with your caregiver. 3 doses, usually over 6 months. ** Family history and personal history of risk and conditions may change your caregiver's recommendations. Document Released: 09/26/2001 Document Revised: 10/23/2011  Document Reviewed: 12/26/2010 [employer] Patient Information 2014 [employer].  

## 2013-04-10 ENCOUNTER — Ambulatory Visit (INDEPENDENT_AMBULATORY_CARE_PROVIDER_SITE_OTHER): Payer: 59 | Admitting: Family Medicine

## 2013-04-10 ENCOUNTER — Encounter: Payer: Self-pay | Admitting: Family Medicine

## 2013-04-10 VITALS — BP 126/89 | HR 77 | Temp 98.2°F | Ht 67.0 in | Wt 324.0 lb

## 2013-04-10 DIAGNOSIS — D649 Anemia, unspecified: Secondary | ICD-10-CM

## 2013-04-10 DIAGNOSIS — R51 Headache: Secondary | ICD-10-CM

## 2013-04-10 LAB — IRON AND TIBC
%SAT: 23 % (ref 20–55)
TIBC: 332 ug/dL (ref 250–470)

## 2013-04-10 MED ORDER — SUMATRIPTAN SUCCINATE 25 MG PO TABS: 25.0000 mg | ORAL_TABLET | Freq: Once | ORAL | Status: DC | PRN

## 2013-04-10 MED ORDER — SUMATRIPTAN SUCCINATE 50 MG PO TABS: ORAL_TABLET | ORAL | Status: DC

## 2013-04-10 NOTE — Progress Notes (Signed)
  Subjective:    Tara Campos is a 47 y.o. female who presents for evaluation of headache. Symptoms began about 1 month ago. Generally, the headaches last about entire day day and occur every day. The headaches are usually worse in the evening. The headaches are usually moderate and dull and are located in right side but alternates side.  The patient rates her most severe headaches a 6-9 on a scale from 1 to 10. Recently, the headaches have been increasing in severity, but decreasing in frequency. Work attendance or other daily activities are not affected by the headaches. Precipitating factors include: none which have been determined. The headaches are usually not preceded by an aura. Associated neurologic symptoms: feels like pressure in ears and feels like in tunnel. The patient denies decreased physical activity, depression, dizziness, loss of balance, muscle weakness, numbness of extremities, speech difficulties, vision problems, vomiting in the early morning and worsening school/work performance. Home treatment has included ibuprofen with some improvement. Other history includes: migraine headaches diagnosed in the past and allergic rhinitis. Family history includes no known family members with significant headaches.  The following portions of the patient's history were reviewed and updated as appropriate: allergies, current medications, past family history, past medical history, past social history, past surgical history and problem list.  Review of Systems Pertinent items are noted in HPI.    Objective:    BP 126/89  Pulse 77  Temp(Src) 98.2 F (36.8 C) (Oral)  Ht 5\' 7"  (1.702 m)  Wt 324 lb (146.965 kg)  BMI 50.73 kg/m2 Head: Normocephalic, without obvious abnormality, atraumatic Eyes: negative findings: lids and lashes normal, conjunctivae and sclerae normal, corneas clear, pupils equal, round, reactive to light and accomodation and no foreign body with everted lid Ears: normal TM's  and external ear canals both ears Nose: Nares normal. Septum midline. Mucosa normal. No drainage or sinus tenderness., sinus tenderness bilateral Throat: lips, mucosa, and tongue normal; teeth and gums normal Neck: no adenopathy, no carotid bruit, no JVD, supple, symmetrical, trachea midline and thyroid not enlarged, symmetric, no tenderness/mass/nodules Neurologic: Alert and oriented X 3, normal strength and tone. Normal symmetric reflexes. Normal coordination and gait    Assessment:    Analgesic rebound headache Tension-type headache, chronic    Plan:    Lie in darkened room and apply cold packs as needed for pain. Episodic therapy: NSAIDs and triptan therapy due to low frequency of pain. Side effect profile discussed in detail. Asked to keep headache diary. Patient reassured that neurodiagnostic workup not indicated from benign H&P.   Pt also with hx of anemia, desires checking Iron level. Ordered CBC and Iron/TIBC level. Tawana Scale, MD OB Fellow

## 2013-04-10 NOTE — Patient Instructions (Signed)
Headache Headaches are caused by many different problems. Most commonly, headache is caused by muscle tension from an injury, fatigue, or emotional upset. Excessive muscle contractions in the scalp and neck result in a headache that often feels like a tight band around the head. Tension headaches often have areas of tenderness over the scalp and the back of the neck. These headaches may last for hours, days, or longer, and some may contribute to migraines in those who have migraine problems. Migraines usually cause a throbbing headache, which is made worse by activity. Sometimes only one side of the head hurts. Nausea, vomiting, eye pain, and avoidance of food are common with migraines. Visual symptoms such as light sensitivity, blind spots, or flashing lights may also occur. Loud noises may worsen migraine headaches. Many factors may cause migraine headaches:  Emotional stress, lack of sleep, and menstrual periods.   Alcohol and some drugs (such as birth control pills).   Diet factors (fasting, caffeine, food preservatives, chocolate).   Environmental factors (weather changes, bright lights, odors, smoke).  Other causes of headaches include minor injuries to the head. Arthritis in the neck; problems with the jaw, eyes, ears, or nose are also causes of headaches. Allergies, drugs, alcohol, and exposure to smoke can also cause moderate headaches. Rebound headaches can occur if someone uses pain medications for a long period of time and then stops. Less commonly, blood vessel problems in the neck and brain (including stroke) can cause various types of headache. Treatment of headaches includes medicines for pain and relaxation. Ice packs or heat applied to the back of the head and neck help some people. Massaging the shoulders, neck and scalp are often very useful. Relaxation techniques and stretching can help prevent these headaches. Avoid alcohol and cigarette smoking as these tend to make headaches  worse. Please see your caregiver if your headache is not better in 2 days.  SEEK IMMEDIATE MEDICAL CARE IF:   You develop a high fever, chills, or repeated vomiting.   You faint or have difficulty with vision.   You develop unusual numbness or weakness of your arms or legs.   Relief of pain is inadequate with medication, or you develop severe pain.   You develop confusion, or neck stiffness.   You have a worsening of a headache or do not obtain relief.  Document Released: 07/31/2005 Document Revised: 07/20/2011 Document Reviewed: 01/24/2007 [employer] Patient Information 2012 [employer]. 

## 2013-04-11 LAB — CBC WITH DIFFERENTIAL/PLATELET
Eosinophils Absolute: 0.1 10*3/uL (ref 0.0–0.7)
Eosinophils Relative: 2 % (ref 0–5)
HCT: 36.5 % (ref 36.0–46.0)
Lymphocytes Relative: 38 % (ref 12–46)
Lymphs Abs: 1.8 10*3/uL (ref 0.7–4.0)
MCH: 24.7 pg — ABNORMAL LOW (ref 26.0–34.0)
MCV: 69.4 fL — ABNORMAL LOW (ref 78.0–100.0)
Monocytes Absolute: 0.3 10*3/uL (ref 0.1–1.0)
RBC: 5.26 MIL/uL — ABNORMAL HIGH (ref 3.87–5.11)
RDW: 15.5 % (ref 11.5–15.5)
WBC: 4.6 10*3/uL (ref 4.0–10.5)

## 2013-08-20 ENCOUNTER — Encounter: Payer: Self-pay | Admitting: Family Medicine

## 2013-08-20 ENCOUNTER — Ambulatory Visit (INDEPENDENT_AMBULATORY_CARE_PROVIDER_SITE_OTHER): Payer: 59 | Admitting: Family Medicine

## 2013-08-20 ENCOUNTER — Other Ambulatory Visit (HOSPITAL_COMMUNITY)
Admission: RE | Admit: 2013-08-20 | Discharge: 2013-08-20 | Disposition: A | Discharge status: Home / Self Care | Payer: 59 | Source: Ambulatory Visit | Attending: Family Medicine | Admitting: Family Medicine

## 2013-08-20 VITALS — BP 128/90 | HR 85 | Temp 98.4°F | Wt 320.0 lb

## 2013-08-20 DIAGNOSIS — R399 Unspecified symptoms and signs involving the genitourinary system: Secondary | ICD-10-CM

## 2013-08-20 DIAGNOSIS — Z113 Encounter for screening for infections with a predominantly sexual mode of transmission: Secondary | ICD-10-CM | POA: Insufficient documentation

## 2013-08-20 DIAGNOSIS — R3989 Other symptoms and signs involving the genitourinary system: Secondary | ICD-10-CM

## 2013-08-20 DIAGNOSIS — N899 Noninflammatory disorder of vagina, unspecified: Secondary | ICD-10-CM

## 2013-08-20 DIAGNOSIS — N949 Unspecified condition associated with female genital organs and menstrual cycle: Secondary | ICD-10-CM

## 2013-08-20 DIAGNOSIS — N898 Other specified noninflammatory disorders of vagina: Secondary | ICD-10-CM

## 2013-08-20 LAB — POCT UA - MICROSCOPIC ONLY

## 2013-08-20 LAB — POCT URINALYSIS DIPSTICK
Bilirubin, UA: NEGATIVE
Glucose, UA: >=1000
KETONES UA: NEGATIVE
Leukocytes, UA: NEGATIVE
Nitrite, UA: NEGATIVE
PH UA: 6.5
PROTEIN UA: 30
SPEC GRAV UA: 1.015
Urobilinogen, UA: 0.2

## 2013-08-20 LAB — POCT WET PREP (WET MOUNT): CLUE CELLS WET PREP WHIFF POC: NEGATIVE

## 2013-08-20 MED ORDER — DOXYCYCLINE HYCLATE 100 MG PO TABS: 100.0000 mg | ORAL_TABLET | Freq: Two times a day (BID) | ORAL | Status: DC

## 2013-08-20 MED ORDER — CEFTRIAXONE SODIUM 250 MG IJ SOLR
250.0000 mg | Freq: Once | INTRAMUSCULAR | Status: AC
  Administered 2013-08-20: 250 mg via INTRAMUSCULAR

## 2013-08-20 MED ORDER — METRONIDAZOLE 500 MG PO TABS: 500.0000 mg | ORAL_TABLET | Freq: Two times a day (BID) | ORAL | Status: DC

## 2013-08-20 NOTE — Patient Instructions (Signed)
Nice to meet you. Please complete the antibiotics prescribed. We will call you with the results of the tests to confirm the cause of this discomfort.  Pelvic Inflammatory Disease Pelvic inflammatory disease (PID) is an infection in some or all of the female organs. PID can be in the uterus, ovaries, fallopian tubes, or the surrounding tissues inside the lower belly area (pelvis). HOME CARE   If given, take your antibiotic medicine as told. Finish them even if you start to feel better.  Only take medicine as told by your doctor.  Do not have sex (intercourse) until treatment is done or as told by your doctor.  Tell your sex partner if you have PID. Your partner may need to be treated.  Keep all doctor visits. GET HELP RIGHT AWAY IF:   You have a fever.  You have more belly (abdominal) or lower belly pain.  You have chills.  You have pain when you pee (urinate).  You are not better after 72 hours.  You have more fluid (discharge) coming from your vagina or fluid that is not normal.  You need pain medicine from your doctor.  You throw up (vomit).  You cannot take your medicines.  Your partner has a sexually transmitted disease (STD). MAKE SURE YOU:   Understand these instructions.  Will watch your condition.  Will get help right away if you are not doing well or get worse. Document Released: 10/27/2008 Document Revised: 11/25/2012 Document Reviewed: 07/27/2011 [facility] Patient Information 2014 [place], [place].

## 2013-08-20 NOTE — Assessment & Plan Note (Addendum)
Patient with burning and itching for the past 2 weeks. Symptoms could be related to UTI vs PID (especially given cervical motion tenderness). Will treat empirically for PID with CTX 250 mg IM, doxy 100 mg BID for 14 days, and flagyl 500 mg PO for 14 days. Wet prep, gonorrhea, chlamydia, and UA obtained. Will call patient with results.

## 2013-08-20 NOTE — Progress Notes (Signed)
Patient ID: Tara Campos, female   DOB: Mar 03, 1966, 48 y.o.   MRN: [id number]  Tommi Rumps, MD Phone: [phone]  Tara Campos is a 48 y.o. female who presents today for burning and itching in the vaginal area. This started 2 weeks ago. She initially though it was related to the wrong soap, though it has persisted with change in soap. She notes burning with urination and a fullness in the bottom of her stomach. She denies discharge. She notes being sexually active with one partner and using condoms. She notes increased urgency and frequency as well.    Past Medical History  Diagnosis Date  . Hypertension   . Uterine bleeding, dysfunctional   . Migraine   . Anemia     History  Smoking status  . Former Smoker  . Quit date: 11/29/2010  Smokeless tobacco  . Never Used    Family History  Problem Relation Age of Onset  . Hypothyroidism Mother   . Hypothyroidism Sister   . Heart disease Mother   . Heart disease Father   . Diabetes Mother   . Diabetes Father   . Hypertension Mother   . Hypertension Father     Current Outpatient Prescriptions on File Prior to Visit  Medication Sig Dispense Refill  . aspirin 81 MG EC tablet Take 81 mg by mouth every morning.       . diphenhydrAMINE (BENADRYL) 25 MG tablet Take 1 tablet (25 mg total) by mouth every 6 (six) hours as needed for sleep.  30 tablet  0  . ferrous sulfate 325 (65 FE) MG tablet Take 325 mg by mouth daily.      Marland Kitchen nystatin (MYCOSTATIN) powder Apply topically 3 (three) times daily.  60 g  5  . SUMAtriptan (IMITREX) 50 MG tablet One tab by mouth biweekly as needed for headache, may repeat once in two hours if needed.  10 tablet  0   No current facility-administered medications on file prior to visit.    ROS: Per HPI   Physical Exam Filed Vitals:   08/20/13 1133  BP: 147/106  Pulse: 85  Temp: 98.4 F (36.9 C)    Physical Examination: General appearance - alert, well appearing, and in no distress Chest -  clear to auscultation, no wheezes, rales or rhonchi, symmetric air entry Heart - normal rate, regular rhythm, normal S1, S2, no murmurs, rubs, clicks or gallops Abdomen - soft, obese, tenderness noted in suprapubic region GU female - normal external vagina and internal vaginal mucosa, no lesions apparent, minimal mucusy discharge noted, no bleeding, on bimanual exam there is cervical motion tenderness and tenderness to palpation of her bladder and uterus   Assessment/Plan: Please see individual problem list.

## 2013-08-22 ENCOUNTER — Telehealth: Payer: Self-pay | Admitting: *Deleted

## 2013-08-22 ENCOUNTER — Other Ambulatory Visit: Payer: Self-pay | Admitting: Family Medicine

## 2013-08-22 MED ORDER — FLUCONAZOLE 150 MG PO TABS: 150.0000 mg | ORAL_TABLET | ORAL | Status: DC

## 2013-08-22 NOTE — Telephone Encounter (Signed)
LM with female (at home number) to have pt call back (attempted all numbers in chart) .  Please inform:   1. Stop Doxycycline  2. Gonorrhea and chlamydia negative  3. Has a yeast infection - MD sent meds to [facility] Symia Herdt, Salome Spotted

## 2013-08-22 NOTE — Telephone Encounter (Signed)
Message copied by Bobbye Riggs on Fri Aug 22, 2013  3:52 PM ------      Message from: Caryl Bis, ERIC G      Created: Fri Aug 22, 2013  2:50 PM      Regarding: Please call patient.       Patients results came back as a yeast infection. I have sent in fluconazole for her to take one tablet now and then one more tablet in 3 days. Her gonorrhea and chlamydia were negative. She can discontinue the antibiotics and take the fluconazole to treat her yeast infection. Please inform the patient of this. Thanks.            Eric ------

## 2013-09-15 ENCOUNTER — Telehealth: Payer: Self-pay | Admitting: Family Medicine

## 2013-09-15 ENCOUNTER — Other Ambulatory Visit: Payer: Self-pay | Admitting: Family Medicine

## 2013-09-15 NOTE — Telephone Encounter (Signed)
I have not had the chance to meet this patient yet. Do not see suppression of BV on med list. Would need to have her in to do wet prep. She was last treated for yeast infection.

## 2013-09-15 NOTE — Telephone Encounter (Signed)
LM with dgt to have pt callback.  Please inform that since she was having some pretty significant pain at her last visit, Dr. Yong Channel would like to see her for a visit first. Kobyn Kray, Salome Spotted

## 2013-09-15 NOTE — Telephone Encounter (Signed)
Pt called and would like a refill on her Flagyl called in to [facility]. jw

## 2013-09-17 ENCOUNTER — Encounter: Payer: Self-pay | Admitting: Family Medicine

## 2013-09-17 ENCOUNTER — Ambulatory Visit (INDEPENDENT_AMBULATORY_CARE_PROVIDER_SITE_OTHER): Payer: 59 | Admitting: Family Medicine

## 2013-09-17 VITALS — BP 138/90 | HR 88 | Temp 98.9°F | Wt 306.0 lb

## 2013-09-17 DIAGNOSIS — I10 Essential (primary) hypertension: Secondary | ICD-10-CM

## 2013-09-17 DIAGNOSIS — R3989 Other symptoms and signs involving the genitourinary system: Secondary | ICD-10-CM

## 2013-09-17 DIAGNOSIS — R7309 Other abnormal glucose: Secondary | ICD-10-CM

## 2013-09-17 DIAGNOSIS — R358 Other polyuria: Secondary | ICD-10-CM

## 2013-09-17 DIAGNOSIS — B379 Candidiasis, unspecified: Secondary | ICD-10-CM

## 2013-09-17 DIAGNOSIS — E119 Type 2 diabetes mellitus without complications: Secondary | ICD-10-CM

## 2013-09-17 DIAGNOSIS — R739 Hyperglycemia, unspecified: Secondary | ICD-10-CM

## 2013-09-17 DIAGNOSIS — R3589 Other polyuria: Secondary | ICD-10-CM

## 2013-09-17 DIAGNOSIS — R399 Unspecified symptoms and signs involving the genitourinary system: Secondary | ICD-10-CM

## 2013-09-17 DIAGNOSIS — E1165 Type 2 diabetes mellitus with hyperglycemia: Principal | ICD-10-CM

## 2013-09-17 DIAGNOSIS — IMO0001 Reserved for inherently not codable concepts without codable children: Secondary | ICD-10-CM

## 2013-09-17 LAB — POCT WET PREP (WET MOUNT): Clue Cells Wet Prep Whiff POC: NEGATIVE

## 2013-09-17 LAB — COMPREHENSIVE METABOLIC PANEL
ALBUMIN: 4.3 g/dL (ref 3.5–5.2)
ALT: 11 U/L (ref 0–35)
AST: 10 U/L (ref 0–37)
Alkaline Phosphatase: 184 U/L — ABNORMAL HIGH (ref 39–117)
BUN: 7 mg/dL (ref 6–23)
CALCIUM: 9.6 mg/dL (ref 8.4–10.5)
CHLORIDE: 100 meq/L (ref 96–112)
CO2: 27 mEq/L (ref 19–32)
CREATININE: 0.87 mg/dL (ref 0.50–1.10)
GLUCOSE: 444 mg/dL — AB (ref 70–99)
POTASSIUM: 4.1 meq/L (ref 3.5–5.3)
Sodium: 137 mEq/L (ref 135–145)
Total Bilirubin: 0.7 mg/dL (ref 0.2–1.2)
Total Protein: 7.3 g/dL (ref 6.0–8.3)

## 2013-09-17 LAB — POCT URINALYSIS DIPSTICK
BILIRUBIN UA: NEGATIVE
GLUCOSE UA: 500
KETONES UA: 15
Nitrite, UA: NEGATIVE
PH UA: 6
Protein, UA: NEGATIVE
Spec Grav, UA: <=1.005
Urobilinogen, UA: 0.2

## 2013-09-17 LAB — LIPID PANEL
CHOL/HDL RATIO: 3.4 ratio
CHOLESTEROL: 174 mg/dL (ref 0–200)
HDL: 51 mg/dL (ref >39)
LDL Cholesterol: 109 mg/dL — ABNORMAL HIGH (ref 0–99)
TRIGLYCERIDES: 72 mg/dL (ref <150)
VLDL: 14 mg/dL (ref 0–40)

## 2013-09-17 LAB — POCT UA - MICROSCOPIC ONLY

## 2013-09-17 LAB — POCT GLYCOSYLATED HEMOGLOBIN (HGB A1C): HEMOGLOBIN A1C: 10.2

## 2013-09-17 MED ORDER — FLUCONAZOLE 150 MG PO TABS: 150.0000 mg | ORAL_TABLET | ORAL | Status: DC

## 2013-09-17 MED ORDER — METFORMIN HCL 1000 MG PO TABS: 500.0000 mg | ORAL_TABLET | Freq: Two times a day (BID) | ORAL | Status: DC

## 2013-09-17 MED ORDER — GLIMEPIRIDE 2 MG PO TABS: 2.0000 mg | ORAL_TABLET | Freq: Every day | ORAL | Status: DC

## 2013-09-17 NOTE — Assessment & Plan Note (Signed)
New onset diabetes based off CBG fasting >300 as well as a1c of 10.2 today. Patient with family history of this. Extensive counseling provided and trying to set up with nutrition clinic vs. Diabetes education depending on patient availability.   Started on amaryl. CMET ordered and if creatinine ok will start metformin. Patient to cut 2-3 sodas a day out. Follow up in 2-3 weeks. Likely the cause of recurrent yeast infections. Also, see AVS.

## 2013-09-17 NOTE — Progress Notes (Signed)
Tara Reddish, MD Phone: [phone]  Subjective:  Chief complaint-noted  Patient describes vaginal itching, irritation, dysuria, and polyuria for about 5 days. She was treated about a month ago for yeast and BV. Symptoms got better with diflucan then resolved. By end of course of flagyl symptoms had completely resolved but restarted late last week.   Patient states she was concerned due to recurrent polyuria and started checking CBGs at home. She has noted CBGs in the morning before eating of either 300 or 400. Admits to a lot of stress recently.   ROS- no fever, chills, nausea, vomiting. Polyuria noted above. No flank pain or CVA tenderness.  Past Medical History-migraines, aneamia, former smoker, hypertension, obesity Family History- DM in both mother and father  Medications- reviewed and updated Current Outpatient Prescriptions  Medication Sig Dispense Refill  . aspirin 81 MG EC tablet Take 81 mg by mouth every morning.       . ferrous sulfate 325 (65 FE) MG tablet Take 325 mg by mouth daily.      Marland Kitchen glimepiride (AMARYL) 2 MG tablet Take 1 tablet (2 mg total) by mouth daily before breakfast.  30 tablet  3  . SUMAtriptan (IMITREX) 50 MG tablet One tab by mouth biweekly as needed for headache, may repeat once in two hours if needed.  10 tablet  0  . fluconazole (DIFLUCAN) 150 MG tablet Take 1 tablet (150 mg total) by mouth every 3 (three) days. For 3 doses.  3 tablet  0  . metFORMIN (GLUCOPHAGE) 1000 MG tablet Take 0.5 tablets (500 mg total) by mouth 2 (two) times daily with a meal.  60 tablet  5   No current facility-administered medications for this visit.    Objective: BP 138/90  Pulse 88  Temp(Src) 98.9 F (37.2 C) (Oral)  Wt 306 lb (138.801 kg) Gen: NAD, resting comfortably on table Pelvic: cervix normal in appearance, external genitalia normal, no adnexal masses or tenderness, no cervical motion tenderness, uterus normal size, shape, and consistency and vagina normal but  mild amount of discharge  CV: RRR no murmurs rubs or gallops Lungs: CTAB no crackles, wheeze, rhonchi Abdomen: soft/nontender/nondistended/normal bowel sounds. No rebound or guarding.  Ext: no edema Skin: warm, dry Neuro: grossly normal, moves all extremities  Results for orders placed in visit on 09/17/13 (from the past 24 hour(s))  POCT URINALYSIS DIPSTICK     Status: Abnormal   Collection Time    09/17/13  8:42 AM      Result Value Range   Color, UA YELLOW     Clarity, UA CLEAR     Glucose, UA 500     Bilirubin, UA NEGATIVE     Ketones, UA 15     Spec Grav, UA <=1.005     Blood, UA TRACE-LYSED     pH, UA 6.0     Protein, UA NEGATIVE     Urobilinogen, UA 0.2     Nitrite, UA NEGATIVE     Leukocytes, UA Trace     Narrative:    Reflex to microscopic  POCT UA - MICROSCOPIC ONLY     Status: Abnormal   Collection Time    09/17/13  8:42 AM      Result Value Range   WBC, Ur, HPF, POC 1-5     RBC, urine, microscopic NONE     Bacteria, U Microscopic TRACE     Epithelial cells, urine per micros 1-5     Yeast, UA FEW  POCT GLYCOSYLATED HEMOGLOBIN (HGB A1C)     Status: Abnormal   Collection Time    09/17/13  8:42 AM      Result Value Range   Hemoglobin A1C 10.2    POCT WET PREP (WET MOUNT)     Status: Abnormal   Collection Time    09/17/13  9:00 AM      Result Value Range   Source Wet Prep POC VAG     WBC, Wet Prep HPF POC >20     Bacteria Wet Prep HPF POC 2+ RODS     Clue Cells Wet Prep HPF POC None     CLUE CELLS WET PREP WHIFF POC Negative Whiff     Yeast Wet Prep HPF POC Few     Trichomonas Wet Prep HPF POC NONE     Assessment/Plan:  Type II diabetes mellitus, uncontrolled New onset diabetes based off CBG fasting >300 as well as a1c of 10.2 today. Patient with family history of this. Extensive counseling provided and trying to set up with nutrition clinic vs. Diabetes education depending on patient availability.   Started on amaryl. CMET ordered and if creatinine  ok will start metformin. Patient to cut 2-3 sodas a day out. Follow up in 2-3 weeks. Likely the cause of recurrent yeast infections. Also, see AVS.   Check lipid panel, discussed would need statin but titrate DM medicines first. Already on aspirin. Will need visit for preventative care assc. With diabetes.   Vaginal Yeast infection Treat with diflucan x 3 as recurrent.  UA with trace bacteria so will send for culture as some dysuria noted but may be caused by yeast and irritation from wiping.   Orders Placed This Encounter  Procedures  . Urine culture  . Lipid panel    Order Specific Question:  Has the patient fasted?    Answer:  No  . Comprehensive metabolic panel    Order Specific Question:  Has the patient fasted?    Answer:  No  . Urinalysis Dipstick  . POCT Wet Prep [employer])  . POCT UA - Microscopic Only  . POCT glycosylated hemoglobin (Hb A1C)    Meds ordered this encounter  Medications  . metFORMIN (GLUCOPHAGE) 1000 MG tablet    Sig: Take 0.5 tablets (500 mg total) by mouth 2 (two) times daily with a meal.    Dispense:  60 tablet    Refill:  5  . glimepiride (AMARYL) 2 MG tablet    Sig: Take 1 tablet (2 mg total) by mouth daily before breakfast.    Dispense:  30 tablet    Refill:  3  . fluconazole (DIFLUCAN) 150 MG tablet    Sig: Take 1 tablet (150 mg total) by mouth every 3 (three) days. For 3 doses.    Dispense:  3 tablet    Refill:  0

## 2013-09-17 NOTE — Patient Instructions (Addendum)
Diabetes  I am sorry to inform you that you have developed diabetes.   Your a1c today was 10.2 and our goal is <7.   It is not a rush to get this below 7 but I would hope we could do this over the next 6 months.   You set goal to exercise 4x a week for 45 minutes on your treadmill  If your kidney function is ok (wait until we call you)    Start Metformin 1/2 a pill in the morning for 3 days, then start 1/2 a pill at night if doing ok   If in 1 week doing ok, increase to full pill in morning for 3 days, then 1 full pill in evening.   Start amaryl 2mg  each morning tomorrow morning.   Schedule an appointment with me and Dr. Jenne Campus next Thursday morning for diabetes education.   Vaginal irritation  This was caused by yeast once again. We will restart the diflucan x 3 doses as recurrent.   Let's check in with each other in 2-3 weeks (after visit with me next week),  Dr. Yong Channel

## 2013-09-18 LAB — URINE CULTURE

## 2013-09-25 ENCOUNTER — Encounter: Payer: Self-pay | Admitting: Family Medicine

## 2013-09-25 ENCOUNTER — Ambulatory Visit (INDEPENDENT_AMBULATORY_CARE_PROVIDER_SITE_OTHER): Payer: 59 | Admitting: Family Medicine

## 2013-09-25 VITALS — Ht 67.0 in | Wt 306.6 lb

## 2013-09-25 DIAGNOSIS — E669 Obesity, unspecified: Secondary | ICD-10-CM

## 2013-09-25 DIAGNOSIS — E1165 Type 2 diabetes mellitus with hyperglycemia: Principal | ICD-10-CM

## 2013-09-25 DIAGNOSIS — IMO0001 Reserved for inherently not codable concepts without codable children: Secondary | ICD-10-CM

## 2013-09-25 DIAGNOSIS — I10 Essential (primary) hypertension: Secondary | ICD-10-CM

## 2013-09-25 NOTE — Patient Instructions (Addendum)
  Diet Recommendations for Diabetes   Starchy (carb) foods include: Bread, rice, pasta, potatoes, corn, crackers, bagels, muffins, all baked goods.  (Fruits, milk, and yogurt also have carbohydrate, but most of these foods will not spike your blood sugar as the starchy foods will.)  A few fruits do cause high blood sugars; use small portions of bananas (limit to 1/2 at a time), grapes, watermelon, and most tropical fruits.    Protein foods include: Meat, fish, poultry, eggs, dairy foods, and beans such as pinto and kidney beans (beans also provide carbohydrate).   1. Eat at least 3 meals and 1-2 snacks per day. Never go more than 4-5 hours while awake without eating.  2. Limit starchy foods to TWO per meal and ONE per snack. ONE portion of a starchy  food is equal to the following:   - ONE slice of bread (or its equivalent, such as half of a hamburger bun).   - 1/2 cup of a "scoopable" starchy food such as potatoes or rice.   - 15 grams of carbohydrate as shown on food label.  3. Both lunch and dinner should include a protein food, a carb food, and vegetables.   - Obtain twice as many veg's as protein or carbohydrate foods for both lunch and dinner.   - Fresh or frozen veg's are best.   - Try to keep frozen veg's on hand for a quick vegetable serving.    4. Breakfast should always include protein.   5. Over the next 2 weeks, check your fasting BG at least 3 X wk.  If you get an especially high reading, IMMEDIATELY write down what you last ate, how much, & what time.    - AT FOLLOW-UP, WE'LL TALK ABOUT PHYSICAL ACTIVITY.     Principles of eating for blood glucose control: 1. Saturated fat (animal fats and veg shortening) tends to KEEP your BG high.   2. Good quality fats:  XV olive oil, canola oil, avocado, unsalted nuts & seeds, fish (non-fried). 3. (Controlling Blood Pressure:  Reduce sodium, but also INCREASE potassium; all fruits & veg's are good to EXCELLENT sources of potassium).   4.  With high-glycemic fruits, limit portion size AND include your fruit with a meal or at least a source of protein.   5. Protein, fat, and fiber all tend to help BG not rise too much.   Next Nutrition appt:  Feb 26 at 10.  If you end up needing to cancel, call Jeannie at [phone] (or talk to Dr. Yong Channel at your Feb 25 appt).

## 2013-09-25 NOTE — Progress Notes (Signed)
Medical Nutrition Therapy:  Appt start time: 1030 end time:  1130.  Assessment:  Primary concerns today: Weight management and Blood sugar control.  Ms. Ramey works at [employer], and usually eats lunch from the cafeteria there.  She does most of the food prep for herself and her dad, who lives with her.  Her dad recently had a stroke, but his DM is diet-controlled.  Ms. Midgett has learned a lot about DM from her dad's experience with it.  She seems highly motivated to make changes to her own diet since her own dx of DM.   Usual eating pattern includes 2 meals and 2 snacks per day. Usual physical activity includes desk exercises 0-3 X day, and occasional TM walking at home.    Frequent foods include soda ~60 oz/day (has switched to diet since dx of DM), diet sweet green tea ~12 oz/day, ice cream ~5 X wk, 2-3 c coffee w/ Equal & liquid creamer, Oodles of Noodles 3-4 X wk.  Avoided foods include none.   Has not checked BG in a couple of days.  Saturday (before starting metformin) her FBG was 444.    24-hr recall: (Up at 6 AM) B (8:45 AM)-   At work: 6 oz [place] yogurt, <1/4 c granola, coffee w/ Equal & l-f creamer Snk ( AM)-   none L (12:30)-  6 oz fried chx (w/ skin), 1 c stir-fried zucchini, 1/2 c mac&chs, diet soda Snk (3 PM)-  1/2 c mandarin oranges, water D (6 PM)-  4 oz grilled fish, 1 c asparagus, 1/2 c rice Snk ( PM)-  1 c sugar-free [place] ice, water  Progress Towards Goal(s):  In progress.   Nutritional Diagnosis:  NB-2.1 Physical inactivity As related to poor motivation.  As evidenced by no regular exercise.    Intervention:  Nutrition education.  Monitoring/Evaluation:  Dietary intake, exercise, FBG, and body weight in 2 week(s).

## 2013-10-01 ENCOUNTER — Other Ambulatory Visit: Payer: Self-pay | Admitting: Family Medicine

## 2013-10-02 MED ORDER — SUMATRIPTAN SUCCINATE 50 MG PO TABS: ORAL_TABLET | ORAL | Status: DC

## 2013-10-02 NOTE — Telephone Encounter (Signed)
Refill imitrex for migraine tx.

## 2013-10-08 ENCOUNTER — Encounter: Payer: Self-pay | Admitting: Family Medicine

## 2013-10-08 ENCOUNTER — Ambulatory Visit (HOSPITAL_COMMUNITY)
Admission: RE | Admit: 2013-10-08 | Discharge: 2013-10-08 | Disposition: A | Discharge status: Home / Self Care | Payer: 59 | Source: Ambulatory Visit | Attending: Family Medicine | Admitting: Family Medicine

## 2013-10-08 ENCOUNTER — Ambulatory Visit (INDEPENDENT_AMBULATORY_CARE_PROVIDER_SITE_OTHER): Payer: 59 | Admitting: Family Medicine

## 2013-10-08 ENCOUNTER — Telehealth: Payer: Self-pay | Admitting: Family Medicine

## 2013-10-08 VITALS — BP 136/86 | HR 125 | Temp 98.4°F | Ht 67.0 in | Wt 300.0 lb

## 2013-10-08 DIAGNOSIS — R1011 Right upper quadrant pain: Secondary | ICD-10-CM

## 2013-10-08 DIAGNOSIS — X58XXXA Exposure to other specified factors, initial encounter: Secondary | ICD-10-CM | POA: Insufficient documentation

## 2013-10-08 DIAGNOSIS — K7689 Other specified diseases of liver: Secondary | ICD-10-CM | POA: Insufficient documentation

## 2013-10-08 DIAGNOSIS — R109 Unspecified abdominal pain: Secondary | ICD-10-CM | POA: Insufficient documentation

## 2013-10-08 DIAGNOSIS — S301XXA Contusion of abdominal wall, initial encounter: Secondary | ICD-10-CM | POA: Insufficient documentation

## 2013-10-08 LAB — APTT: APTT: 29 s (ref 24–37)

## 2013-10-08 LAB — COMPREHENSIVE METABOLIC PANEL
ALBUMIN: 3.8 g/dL (ref 3.5–5.2)
ALT: 17 U/L (ref 0–35)
AST: 18 U/L (ref 0–37)
Alkaline Phosphatase: 137 U/L — ABNORMAL HIGH (ref 39–117)
BUN: 6 mg/dL (ref 6–23)
CO2: 22 meq/L (ref 19–32)
Calcium: 9 mg/dL (ref 8.4–10.5)
Chloride: 103 mEq/L (ref 96–112)
Creat: 0.86 mg/dL (ref 0.50–1.10)
GLUCOSE: 172 mg/dL — AB (ref 70–99)
POTASSIUM: 4 meq/L (ref 3.5–5.3)
SODIUM: 143 meq/L (ref 135–145)
TOTAL PROTEIN: 7.8 g/dL (ref 6.0–8.3)
Total Bilirubin: 0.5 mg/dL (ref 0.3–1.2)

## 2013-10-08 LAB — CBC WITH DIFFERENTIAL/PLATELET
Basophils Absolute: 0 10*3/uL (ref 0.0–0.1)
Basophils Relative: 0 % (ref 0–1)
Eosinophils Absolute: 0.1 10*3/uL (ref 0.0–0.7)
Eosinophils Relative: 1 % (ref 0–5)
HCT: 41 % (ref 36.0–46.0)
Hemoglobin: 15.1 g/dL — ABNORMAL HIGH (ref 12.0–15.0)
LYMPHS PCT: 21 % (ref 12–46)
Lymphs Abs: 1.1 10*3/uL (ref 0.7–4.0)
MCH: 25.6 pg — AB (ref 26.0–34.0)
MCHC: 36.8 g/dL — ABNORMAL HIGH (ref 30.0–36.0)
MCV: 69.6 fL — ABNORMAL LOW (ref 78.0–100.0)
MONOS PCT: 9 % (ref 3–12)
Monocytes Absolute: 0.5 10*3/uL (ref 0.1–1.0)
NEUTROS ABS: 3.7 10*3/uL (ref 1.7–7.7)
NEUTROS PCT: 69 % (ref 43–77)
PLATELETS: 256 10*3/uL (ref 150–400)
RBC: 5.89 MIL/uL — AB (ref 3.87–5.11)
RDW: 15.5 % (ref 11.5–15.5)
WBC: 5.4 10*3/uL (ref 4.0–10.5)

## 2013-10-08 LAB — PROTIME-INR
INR: 1.08 (ref <1.50)
Prothrombin Time: 13.8 seconds (ref 11.6–15.2)

## 2013-10-08 LAB — LIPASE: Lipase: 22 U/L (ref 11–59)

## 2013-10-08 MED ORDER — ONDANSETRON 4 MG PO TBDP: 4.0000 mg | ORAL_TABLET | Freq: Three times a day (TID) | ORAL | Status: DC | PRN

## 2013-10-08 MED ORDER — TRAMADOL HCL 50 MG PO TABS: 50.0000 mg | ORAL_TABLET | Freq: Three times a day (TID) | ORAL | Status: DC | PRN

## 2013-10-08 NOTE — Patient Instructions (Signed)
I am worried about your gallbladder and that it may be inflammed. We need to get an ultrasound to evaluate for cholecystitis. I am also ordering stat labs as below Orders Placed This Encounter  Procedures  . US Abdomen Complete  . Lipase  . Comprehensive metabolic panel  . CBC with Differential  . Protime-INR  . APTT   It is possible this is just a stomach bug but given the brusing and tenderness in your abdomen I think we need to be more aggressive in our evaluation. You will hear from me later today about your labs.  Dr. Yong Channel

## 2013-10-08 NOTE — Telephone Encounter (Signed)
Pt said that she received a call from [place] and wanted to know why we were calling. I didn't see any notes in the files. jw

## 2013-10-08 NOTE — Progress Notes (Signed)
Tara Reddish, MD Phone: [phone]  Subjective:  Chief complaint-noted  Nausea/vomiting/RUQ pain On Monday, patient started with some nausea/vomiting a few hours after taking metformin and continued to have issues throughout the day. Diarrhea later started. Nonbilious/nonbloody emesis. No blood in stool. Took her PM metformin and symptoms seemed to worsen. Did not take metformin or amaryl on Tuesday yet symptoms persisted. Admits to RUQ pain but also has a bruise on her RUQ with no history of trauma. Does take aspirin but no other blood thinner. States CBGs have been less than 200. Denies any alcohol use or history of hypertriglyceridemia or history pancreatitis.  ROS- no fever/chills. Admits to poor PO but is able to keep down some fluids. No dysuria. Polyuria improved with blood sugar control.  Past Medical History- Similar episode to current episode 2-3 days ago with normal utlrasound and no cause found, DM Type II recently diagnosed, migraines, history tobacco abuse, obesity, hypertension Past Surgical History- No history of cholecystectomy  Medications- reviewed and updated Current Outpatient Prescriptions  Medication Sig Dispense Refill  . aspirin 81 MG EC tablet Take 81 mg by mouth every morning.       . ferrous sulfate 325 (65 FE) MG tablet Take 325 mg by mouth daily.      . Multiple Vitamins-Minerals (MULTIVITAMIN WITH MINERALS) tablet Take 1 tablet by mouth daily.      Marland Kitchen glimepiride (AMARYL) 2 MG tablet Take 1 tablet (2 mg total) by mouth daily before breakfast.  30 tablet  3  . metFORMIN (GLUCOPHAGE) 1000 MG tablet Take 0.5 tablets (500 mg total) by mouth 2 (two) times daily with a meal.  60 tablet  5  . ondansetron (ZOFRAN-ODT) 4 MG disintegrating tablet Take 1 tablet (4 mg total) by mouth every 8 (eight) hours as needed for nausea or vomiting.  20 tablet  0  . SUMAtriptan (IMITREX) 50 MG tablet One tab by mouth biweekly as needed for headache, may repeat once in two hours  if needed.  10 tablet  1  . traMADol (ULTRAM) 50 MG tablet Take 1 tablet (50 mg total) by mouth every 8 (eight) hours as needed for moderate pain.  20 tablet  0   No current facility-administered medications for this visit.    Objective: BP 136/86  Pulse 125  Temp(Src) 98.4 F (36.9 C) (Oral)  Ht 5\' 7"  (1.702 m)  Wt 300 lb (136.079 kg)  BMI 46.98 kg/m2 Gen: appears slightly uncomfortable on table, very uncomfortable laying down HEENT: mucous membrane moist CV: RRR no murmurs rubs or gallops Lungs: CTAB no crackles, wheeze, rhonchi Abdomen: soft, very tender in RUQ with positive Murphy's sign. This pain is located just above a bruise which is about 8 x 8 cm. Nondistended/normal bowel sounds. No rebound or guarding.  Ext: trace edema Skin: warm, dry, bruise as noted above otherwise no rash. No rash on back specifically or sign of retroperitoneal bleed.  MSK: does have mild pain in right CVA but states "tweaked back" last week.   Assessment/Plan:  RUQ Abdominal Pain/nausea/vomiting Concern for cholecystitis vs. Pancreatitis vs. Viral gastroenteritis. Stat labs ordered. Overweight female in 38s with 3 kids-meets typical demographic for cholecystitis and exam would also fit this picture. Ordered pt/ptt/inr given bruising on abdomen. Will obtain ultrasound this morning. Similar episode 2-3 years ago with normal ultrasound and patient ultimately improved with tramadol. Symptomatically gave tramadol and zofran.    Orders Placed This Encounter  Procedures  . US Abdomen Complete  . Lipase  .  Comprehensive metabolic panel  . CBC with Differential  . Protime-INR  . APTT    Meds ordered this encounter  Medications  . ondansetron (ZOFRAN-ODT) 4 MG disintegrating tablet    Sig: Take 1 tablet (4 mg total) by mouth every 8 (eight) hours as needed for nausea or vomiting.    Dispense:  20 tablet    Refill:  0  . traMADol (ULTRAM) 50 MG tablet    Sig: Take 1 tablet (50 mg total) by mouth  every 8 (eight) hours as needed for moderate pain.    Dispense:  20 tablet    Refill:  0

## 2013-10-08 NOTE — Telephone Encounter (Signed)
See my discussion with patient as result notes from today. Reassuring overall.

## 2013-10-09 ENCOUNTER — Ambulatory Visit: Payer: 59 | Admitting: Family Medicine

## 2013-11-06 ENCOUNTER — Encounter: Payer: Self-pay | Admitting: Family Medicine

## 2013-11-06 ENCOUNTER — Ambulatory Visit (INDEPENDENT_AMBULATORY_CARE_PROVIDER_SITE_OTHER): Payer: 59 | Admitting: Family Medicine

## 2013-11-06 VITALS — BP 121/86 | HR 80 | Temp 98.1°F | Wt 302.0 lb

## 2013-11-06 DIAGNOSIS — F17201 Nicotine dependence, unspecified, in remission: Secondary | ICD-10-CM

## 2013-11-06 DIAGNOSIS — E1165 Type 2 diabetes mellitus with hyperglycemia: Secondary | ICD-10-CM

## 2013-11-06 DIAGNOSIS — Z87891 Personal history of nicotine dependence: Secondary | ICD-10-CM

## 2013-11-06 DIAGNOSIS — IMO0001 Reserved for inherently not codable concepts without codable children: Secondary | ICD-10-CM

## 2013-11-06 DIAGNOSIS — E119 Type 2 diabetes mellitus without complications: Secondary | ICD-10-CM

## 2013-11-06 MED ORDER — METFORMIN HCL 500 MG PO TABS: 250.0000 mg | ORAL_TABLET | Freq: Two times a day (BID) | ORAL | Status: DC

## 2013-11-06 MED ORDER — GLIMEPIRIDE 4 MG PO TABS: 4.0000 mg | ORAL_TABLET | Freq: Every day | ORAL | Status: DC

## 2013-11-06 NOTE — Patient Instructions (Addendum)
Diabetes  Metformin   Do not take for 2 days   THEN, Start with 1/2 pill (250mg  in morning)   In 1 week, increase to twice a day   1 week later if no stomach upset, increase to 1 pill in AM and 1/2 pill PM   1 week later if no stomach upset, increase to 1 pill twice a day  Amaryl   Increase to 4mg  (2 of your current pills until you run out then new prescription)  Call me in 3-4 weeks to update me on blood sugars. We will likely double your amaryl.   Keep seeing Dr. Jenne Campus. Your healthy eating and regular exercise are essentially the 3rd medicine that we need. I am hopeful we can get you below 8 for your a1c by May but it may take a little longer.   See you around May 4th,  Dr. Yong Channel

## 2013-11-06 NOTE — Progress Notes (Signed)
  Garret Reddish, MD Phone: [phone]  Subjective:  Chief complaint-noted  Tara Campos is a 48 y.o. year old very pleasant female patient who presents with the following:  DIABETES Type II Medications taking and tolerating-yes, amaryl 2mg , metformin 1/2 pill twice a day 1g. Gets nauseous on metformin if does not take with ondansetron Blood Sugars per patient-fasting-mornings once a week ranging from 170-180 Diet-states "boring" but feels like doing well. Down to 1 soda a day or less (previously at least 3 a day). Working with Dr. Jenne Campus and feels like this is very helpful.  Regular Exercise-treadmills in AM (30) x 3-4 days and after lunch (20) x 5 days   On Aspirin-not currently On statin-plan to start once fully titrated up on diabetes medications Daily foot monitoring-no   ROS- Denies Polyuria,Polydipsia, nocturia (previously was urinating frequently), Vision changes, feet or hand numbness/pain/tingling. Endorses Hypoglycemia symptoms (shaky, sweaty, hungry, weak anxious, tremor, palpitations, confusion, behavior change) if missing meals   Hemoglobin a1c:  Lab Results  Component Value Date   HGBA1C 10.2 09/17/2013   Past Medical History- Patient Active Problem List   Diagnosis Date Noted  . Type II diabetes mellitus, uncontrolled 09/17/2013  . Vaginal discomfort 08/20/2013  . Migraine headache 04/12/2012  . Anemia 09/28/2011  . Tobacco abuse, in remission 03/01/2011  . FIBROIDS, UTERUS 08/23/2010  . OBESITY, NOS 10/11/2006  . HYPERTENSION, BENIGN SYSTEMIC 10/11/2006   Medications- reviewed and updated Current Outpatient Prescriptions  Medication Sig Dispense Refill  . glimepiride (AMARYL) 2 MG tablet Take 1 tablet (2 mg total) by mouth daily before breakfast.  30 tablet  3  . metFORMIN (GLUCOPHAGE) 1000 MG tablet Take 0.5 tablets (500 mg total) by mouth 2 (two) times daily with a meal.  60 tablet  5  . Multiple Vitamins-Minerals (MULTIVITAMIN WITH MINERALS) tablet Take  1 tablet by mouth daily.      . ondansetron (ZOFRAN-ODT) 4 MG disintegrating tablet Take 1 tablet (4 mg total) by mouth every 8 (eight) hours as needed for nausea or vomiting.  20 tablet  0  . SUMAtriptan (IMITREX) 50 MG tablet One tab by mouth biweekly as needed for headache, may repeat once in two hours if needed.  10 tablet  1  . aspirin 81 MG EC tablet Take 81 mg by mouth every morning.       . ferrous sulfate 325 (65 FE) MG tablet Take 325 mg by mouth daily.       No current facility-administered medications for this visit.    Objective: BP 121/86  Pulse 80  Temp(Src) 98.1 F (36.7 C) (Oral)  Wt 302 lb (136.986 kg) Gen: NAD, resting comfortably in chair CV: RRR no murmurs rubs or gallops Lungs: CTAB no crackles, wheeze, rhonchi Abdomen: soft/nondistended/normal bowel sounds. No rebound or guarding. Mild epigastric pain Ext: no edema Skin: warm, dry, healing bruise on mid abdomen  Assessment/Plan:  No problem-specific assessment & plan notes found for this encounter.   No orders of the defined types were placed in this encounter.    No orders of the defined types were placed in this encounter.

## 2013-11-07 NOTE — Assessment & Plan Note (Addendum)
Poorly controlled based off fasting sugars in 170-180 range. Patient having to take zofran to tolerate metformin so will decrease and slowly increase again (see AVS). Increase amaryl to 4mg . Patient to call in 3-4 weeks with blood sugars with plan to titrate up to 8mg  if able. Concern that 2 medicine plus diet/exercise will be enough but hopeful. May need to consider 3rd agent such as januvia vs. Insulin but would lean toward [place] and continued weight loss efforts. Patient to see nutrition in 3 weeks and me in 6.

## 2013-11-07 NOTE — Assessment & Plan Note (Signed)
Continues to abstain. Will remove from problem list.

## 2013-11-11 ENCOUNTER — Telehealth: Payer: Self-pay | Admitting: Family Medicine

## 2013-11-11 NOTE — Telephone Encounter (Signed)
LVM that FMLA forms available up front.

## 2013-12-08 ENCOUNTER — Ambulatory Visit (INDEPENDENT_AMBULATORY_CARE_PROVIDER_SITE_OTHER): Payer: 59 | Admitting: Family Medicine

## 2013-12-08 ENCOUNTER — Encounter: Payer: Self-pay | Admitting: Family Medicine

## 2013-12-08 ENCOUNTER — Telehealth: Payer: Self-pay | Admitting: Family Medicine

## 2013-12-08 VITALS — Ht 67.5 in | Wt 305.5 lb

## 2013-12-08 DIAGNOSIS — E669 Obesity, unspecified: Secondary | ICD-10-CM

## 2013-12-08 DIAGNOSIS — E1165 Type 2 diabetes mellitus with hyperglycemia: Principal | ICD-10-CM

## 2013-12-08 DIAGNOSIS — IMO0001 Reserved for inherently not codable concepts without codable children: Secondary | ICD-10-CM

## 2013-12-08 NOTE — Telephone Encounter (Signed)
LVM for patient to return call:   If no longer needing zofran to take metformin, may increase to 500 mg in the morning and keep 250 mg at night for 10 days then increase to 500 mg twice a day.   If needs zofran or still with some nausea, remain at 250 mg twice a day until she sees me next.

## 2013-12-08 NOTE — Progress Notes (Signed)
Medical Nutrition Therapy:  Appt start time: 1030 end time:  1130.  Assessment:  Primary concerns today: Weight management and Blood sugar control.  Tara Campos has been eating a salad for lunch every day, and some vegetables at dinner as well, and she has been getting more fruit.  She said her biggest challenge is making sure she eats all of her meals, esp dinner; she is getting 3 meals a day only about 3 days a week.  Obstacles to eating meals consistently are time constraints related to caring for her father, and now also caring for her son, who had knee surgery 3 weeks ago from a severe basketball injury.  (He will not be able to bend his knee for 3 months, so will be pretty dependent on his mom.)   Tara Campos has been walking on the TM after lunch for 20 minutes M-F.    FBG have been 160's, but this morning was 142, probably b/c of yesterday's meager intake:  No breakfast or lunch, banana in the early afternoon, followed by dinner at 7:30 PM of only 1 cup veg-beef stew.   Tara Campos will likely benefit from increasing her metformin dose; is currently tolerating 250 mg bid well.    Saturday's intake: (Up at 7:30 AM) B ( AM)-  none Snk ( AM)-  none L (2 PM)-  1 [place] sandwich w/ chs, cherry tomatoes Snk ( PM)-  none D (6 PM)-  [facility] 1/2-size chx apple-pecan salad, diet Dr. Malachi Bonds Snk (9 PM)-  1 bag low-fat microwave popcorn, water  Progress Towards Goal(s):  In progress.   Nutritional Diagnosis:  NB-2.1 Physical inactivity As related to poor motivation.  As evidenced by 20 min of walking M-F on most weeks.    Intervention:  Nutrition education.  Monitoring/Evaluation:  Dietary intake, exercise, FBG, and body weight in 8 week(s).

## 2013-12-08 NOTE — Patient Instructions (Addendum)
-   Great job in getting more vegetables.   - The key to getting dinner more consistency will be advance planning.    - Try to keep on hand meal components that are easy to just heat up.   - Quick Fixes:   - Try the mixed greens in the bag at [facility] for salads or for sauteeing.   - When you cook meat, fish, or poultry, prepare LARGE amounts, and freeze.   - Microwaved veg's with pre-cooked meat from the freezer.     - Canned tuna/salmon, peanut butter (look for natural, trans fat-free), plain [place] yogurt on salad, beans or hummus.    - No-salt-added beans are available at [place] for 99 cents up to $1.29.   - Continue the TM walking M-F.  In addition, make an effort to take micro-breaks:  5 min of moving for every hour you sit OR 1 min for every 20 min you sit.    - For the next two weeks, document your micro-breaks AND continue to record FBG daily.  Average your weekly FBG's, and compare weeks from before to after starting micro-breaks.

## 2013-12-08 NOTE — Telephone Encounter (Signed)
Message copied by Marin Olp on Mon Dec 08, 2013 11:33 AM ------      Message from: Kennith Center      Created: Mon Dec 08, 2013  9:46 AM      Regarding: Increase metformin?       Annie Main,       You were probably planning to increase Ms. Zheng's metformin anyway, but I am just sending this message to let you know:  (1) She is tolerating the 250 mg well; (2) FBG is still >160 most days EVEN though she often skips dinner!      So - higher metformin will be a good choice if she can tolerate it this time around.       Thx,       Jeannie ------

## 2013-12-15 ENCOUNTER — Ambulatory Visit (INDEPENDENT_AMBULATORY_CARE_PROVIDER_SITE_OTHER): Payer: 59 | Admitting: Family Medicine

## 2013-12-15 ENCOUNTER — Encounter: Payer: Self-pay | Admitting: Family Medicine

## 2013-12-15 VITALS — BP 116/72 | HR 85 | Temp 98.4°F | Ht 67.5 in | Wt 309.0 lb

## 2013-12-15 DIAGNOSIS — E1165 Type 2 diabetes mellitus with hyperglycemia: Principal | ICD-10-CM

## 2013-12-15 DIAGNOSIS — IMO0001 Reserved for inherently not codable concepts without codable children: Secondary | ICD-10-CM

## 2013-12-15 LAB — POCT GLYCOSYLATED HEMOGLOBIN (HGB A1C): Hemoglobin A1C: 5.3

## 2013-12-15 NOTE — Assessment & Plan Note (Addendum)
Greatly improved a1c (10.2 to 5.3 with verification a1c of 5.4) with dietary changes, exercise almost an hour a day, metformin 1g total daily and amaryl 4mg . Given a1c of 5.3, decision made at this time to discontinue amaryl in order to avoid potential hypoglycemic events. Follow up in 3 months, if a1c >7 would try to titrate metformin up (previous GI issues-could try extended release). Congratulated patient on lifestyle changes and major improvement in a1c.   Would check lipids at next visit and if LDL <100, consider holding off on statin.  Patient also not ready to take aspirin at present but discussed considering given history of CAD in mother with MI age 61 (consider delaying x 10 years)

## 2013-12-15 NOTE — Progress Notes (Signed)
  Garret Reddish, MD Phone: [phone]  Subjective:  Chief complaint-noted  Tara Campos is a 48 y.o. year old very pleasant female patient who presents with the following:  DIABETES Type II Medications taking and tolerating-yes, amaryl 4mg , metformin 1/2 pill twice a day 1g. No longer needs ondansetron while taking Blood Sugars per patient-fasting-mornings 3x a week ranging from 149-160 Diet-states "boring" but feels like doing well. Still at 1 soda a day. Dr. Jenne Campus visit previously was very helpful Regular Exercise-treadmills in AM (30) x 3-4 days and after lunch (20) x 5 days   On Aspirin-does not desire to take at present On statin-no, want to see how lipids are after lifestyle changes (consider repeat in 3 months)  ROS- Denies Polyuria,Polydipsia, nocturia, Vision changes, feet or hand numbness/pain/tingling. Endorses Hypoglycemia symptoms (shaky, sweaty, hungry, weak anxious, tremor, palpitations, confusion, behavior change) x1 since last visit when didn't eat breakfast and still took amaryl and CBG was 95  Hemoglobin a1c:  Lab Results  Component Value Date   HGBA1C 5.3 12/15/2013   HGBA1C 10.2 09/17/2013   Past Medical History- diabetes, obesity, hypertension history, anemia history with history of fibroids, migraines  Medications- reviewed and updated Current Outpatient Prescriptions  Medication Sig Dispense Refill  . glimepiride (AMARYL) 4 MG tablet Take 1 tablet (4 mg total) by mouth daily before breakfast.  30 tablet  3  . metFORMIN (GLUCOPHAGE) 500 MG tablet Take 0.5 tablets (250 mg total) by mouth 2 (two) times daily with a meal.  60 tablet  5  . Multiple Vitamins-Minerals (MULTIVITAMIN WITH MINERALS) tablet Take 1 tablet by mouth daily.      . SUMAtriptan (IMITREX) 50 MG tablet One tab by mouth biweekly as needed for headache, may repeat once in two hours if needed.  10 tablet  1     Objective: BP 116/72  Pulse 85  Temp(Src) 98.4 F (36.9 C) (Oral)  Ht 5' 7.5"  (1.715 m)  Wt 309 lb (140.161 kg)  BMI 47.65 kg/m2 Gen: NAD, resting comfortably in chair CV: RRR no murmurs rubs or gallops Lungs: CTAB no crackles, wheeze, rhonchi Ext: no edema  Results for orders placed in visit on 12/15/13 (from the past 24 hour(s))  POCT GLYCOSYLATED HEMOGLOBIN (HGB A1C)     Status: None   Collection Time    12/15/13  4:28 PM      Result Value Ref Range   Hemoglobin A1C 5.3      Assessment/Plan:  Diabetes Mellitus Type II, controlled Greatly improved a1c (10.2 to 5.3 with verification a1c of 5.4) with dietary changes, exercise almost an hour a day, metformin 1g total daily and amaryl 4mg . Given a1c of 5.3, decision made at this time to discontinue amaryl in order to avoid potential hypoglycemic events. Follow up in 3 months, if a1c >7 would try to titrate metformin up (previous GI issues-could try extended release). Congratulated patient on lifestyle changes and major improvement in a1c.   Would check lipids at next visit and if LDL <100, consider holding off on statin.  Patient also not ready to take aspirin at present but discussed considering given history of CAD in mother with MI age 28 (consider delaying x 10 years)

## 2014-02-02 ENCOUNTER — Ambulatory Visit: Payer: 59 | Admitting: Family Medicine

## 2014-05-26 ENCOUNTER — Emergency Department
Admission: EM | Admit: 2014-05-26 | Discharge: 2014-05-26 | Disposition: A | Discharge status: Home / Self Care | Payer: 59 | Source: Home / Self Care | Attending: Emergency Medicine | Admitting: Emergency Medicine

## 2014-05-26 ENCOUNTER — Encounter: Payer: Self-pay | Admitting: Emergency Medicine

## 2014-05-26 DIAGNOSIS — H65199 Other acute nonsuppurative otitis media, unspecified ear: Secondary | ICD-10-CM

## 2014-05-26 HISTORY — DX: Type 2 diabetes mellitus without complications: E11.9

## 2014-05-26 MED ORDER — AMOXICILLIN 500 MG PO CAPS: 500.0000 mg | ORAL_CAPSULE | Freq: Three times a day (TID) | ORAL | Status: DC

## 2014-05-26 NOTE — Discharge Instructions (Signed)

## 2014-05-26 NOTE — ED Notes (Signed)
Pt c/o her ears feeling "stuffy" bilaterally x 2 days. Denies pain.

## 2014-05-26 NOTE — ED Provider Notes (Signed)
CSN: [id number]     Arrival date & time 05/26/14  1435 History   First MD Initiated Contact with Patient 05/26/14 1501     Chief Complaint  Patient presents with  . Ear Fullness   (Consider location/radiation/quality/duration/timing/severity/associated sxs/prior Treatment) Patient is a 48 y.o. female presenting with ear pain. The history is provided by the patient. No language interpreter was used.  Otalgia Location:  Bilateral Quality:  Aching, throbbing and dull Severity:  Moderate Onset quality:  Gradual Duration:  4 days Timing:  Constant Progression:  Worsening Chronicity:  New Relieved by:  Nothing Worsened by:  Nothing tried Ineffective treatments:  OTC medications Associated symptoms: congestion and hearing loss   Associated symptoms: no vomiting   Risk factors: no chronic ear infection     Past Medical History  Diagnosis Date  . Hypertension   . Uterine bleeding, dysfunctional   . Migraine   . Anemia   . Diabetes mellitus without complication    Past Surgical History  Procedure Laterality Date  . Endometrial ablation  March 2012    For DUB, now resolved  . Cesarean section      X3   Family History  Problem Relation Age of Onset  . Hypothyroidism Mother   . Hypothyroidism Sister   . Heart disease Mother 11    MI  . Heart disease Father   . Diabetes Mother   . Diabetes Father   . Hypertension Mother   . Hypertension Father    History  Substance Use Topics  . Smoking status: Former Smoker    Quit date: 11/29/2010  . Smokeless tobacco: Never Used  . Alcohol Use: No   OB History   Grav Para Term Preterm Abortions TAB SAB Ect Mult Living   3 3 3       3      Review of Systems  HENT: Positive for congestion, ear pain and hearing loss.   Gastrointestinal: Negative for vomiting.  All other systems reviewed and are negative.   Allergies  Betadine  Home Medications   Prior to Admission medications   Medication Sig Start Date End Date Taking?  Authorizing Provider  amoxicillin (AMOXIL) 500 MG capsule Take 1 capsule (500 mg total) by mouth 3 (three) times daily. 05/26/14   Fransico Meadow, PA-C  metFORMIN (GLUCOPHAGE) 500 MG tablet Take 0.5 tablets (250 mg total) by mouth 2 (two) times daily with a meal. 11/06/13   Marin Olp, MD  Multiple Vitamins-Minerals (MULTIVITAMIN WITH MINERALS) tablet Take 1 tablet by mouth daily.    Historical Provider, MD  SUMAtriptan (IMITREX) 50 MG tablet One tab by mouth biweekly as needed for headache, may repeat once in two hours if needed. 10/02/13   Allen Norris, MD   BP 166/111  Pulse 80  Temp(Src) 97.4 F (36.3 C) (Oral)  Resp 18  Ht 5' 7.5" (1.715 m)  Wt 320 lb (145.151 kg)  BMI 49.35 kg/m2  SpO2 100% Physical Exam  Nursing note and vitals reviewed. Constitutional: She is oriented to person, place, and time. She appears well-developed and well-nourished.  HENT:  Head: Normocephalic and atraumatic.  Mouth/Throat: Oropharynx is clear and moist.  r tm dull, fluid behind  Left tm fluid no redness,   Eyes: EOM are normal. Pupils are equal, round, and reactive to light.  Neck: Normal range of motion.  Cardiovascular: Normal rate and normal heart sounds.   Pulmonary/Chest: Effort normal.  Abdominal: Soft. She exhibits no distension.  Musculoskeletal: Normal range  of motion.  Neurological: She is alert and oriented to person, place, and time.  Skin: Skin is warm and dry.  Psychiatric: She has a normal mood and affect.    ED Course  Procedures (including critical care time) Labs Review Labs Reviewed - No data to display  Imaging Review No results found.   MDM Pt has some sinus symptoms as well.  Pt BP elevated due to decongestants.      1. Subacute nonsuppurative otitis media, unspecified laterality    amoxicillin Ask pharmacist about decongestant that will not increase bp Gum,  Pop ears, Afrin x 2 days AVS    Fransico Meadow, PA-C 05/26/14 1628

## 2014-05-28 NOTE — ED Provider Notes (Signed)
His English is fair. We called his daughter on his cell phone, and I explained the above and she explained the instructions in his native language. He voiced understanding and agreement.   Jacqulyn Cane, MD 05/28/14 [phone]

## 2014-06-15 ENCOUNTER — Encounter: Payer: Self-pay | Admitting: Emergency Medicine

## 2014-06-15 ENCOUNTER — Other Ambulatory Visit: Payer: Self-pay | Admitting: Family Medicine

## 2014-06-18 ENCOUNTER — Other Ambulatory Visit: Payer: Self-pay | Admitting: *Deleted

## 2014-06-18 MED ORDER — SUMATRIPTAN SUCCINATE 50 MG PO TABS: ORAL_TABLET | ORAL | Status: DC

## 2014-07-14 ENCOUNTER — Ambulatory Visit (INDEPENDENT_AMBULATORY_CARE_PROVIDER_SITE_OTHER): Payer: 59 | Admitting: Family Medicine

## 2014-07-14 ENCOUNTER — Encounter: Payer: Self-pay | Admitting: Family Medicine

## 2014-07-14 VITALS — BP 129/85 | HR 85 | Temp 98.5°F | Ht 68.0 in | Wt 317.0 lb

## 2014-07-14 DIAGNOSIS — E119 Type 2 diabetes mellitus without complications: Secondary | ICD-10-CM

## 2014-07-14 LAB — POCT GLYCOSYLATED HEMOGLOBIN (HGB A1C): Hemoglobin A1C: 6.1

## 2014-07-14 MED ORDER — METFORMIN HCL ER 500 MG PO TB24: 500.0000 mg | ORAL_TABLET | Freq: Every day | ORAL | Status: DC

## 2014-07-14 NOTE — Progress Notes (Signed)
Patient ID: Tara Campos, female   DOB: Mar 04, 1966, 48 y.o.   MRN: [id number]   [facility] Bernadene Bell, MD Phone: [phone]  Subjective:  Tara Campos is a 48 y.o F who presents today for DM f/up  DIABETES Disease Monitoring: A1c 6.1 (07/14/2014) Blood Sugar ranges-fasting 140-143/ post-prandial about the same  Polyuria/phagia/dipsia- none    Visual problems- none  Medications: metformin 1g; stopped amaryl 4mg  in May Compliance- daily Hypoglycemic symptoms- noen Significant dietary changes and exercise as well as medication compliance  All relevant systems were reviewed and were negative unless otherwise noted in the HPI  Past Medical History Reviewed problem list.  Medications- reviewed and updated Current Outpatient Prescriptions  Medication Sig Dispense Refill  . amoxicillin (AMOXIL) 500 MG capsule Take 1 capsule (500 mg total) by mouth 3 (three) times daily. 30 capsule 0  . metFORMIN (GLUCOPHAGE) 500 MG tablet Take 0.5 tablets (250 mg total) by mouth 2 (two) times daily with a meal. 60 tablet 5  . Multiple Vitamins-Minerals (MULTIVITAMIN WITH MINERALS) tablet Take 1 tablet by mouth daily.    . SUMAtriptan (IMITREX) 50 MG tablet One tab by mouth biweekly as needed for headache, may repeat once in two hours if needed. 10 tablet 1   No current facility-administered medications for this visit.   Chief complaint-noted No additions to family history Social history- patient is a former smoker  Objective: BP 129/85 mmHg  Pulse 85  Temp(Src) 98.5 F (36.9 C) (Oral)  Ht 5\' 8"  (1.727 m)  Wt 317 lb (143.79 kg)  BMI 48.21 kg/m2 Gen: NAD, alert, cooperative with exam Ext: No edema, warm, normal tone, moves UE/LE spontaneously Neuro: Alert and oriented, No gross deficits Skin: no rashes no lesions **see foot exam**  Assessment/Plan: See problem based a/p

## 2014-07-14 NOTE — Patient Instructions (Signed)
It was great to see you today!  I am pleased to hear that things are going well for you.  Let me know how you are doing with metformin XR Keep up the good work   We will see you back in a three to six month period Looking forward to seeing you soon Happy holidays! Bernadene Bell, MD

## 2014-07-14 NOTE — Assessment & Plan Note (Signed)
Improved overall and stable  Will cont metformin but switch to XR Increase to 750 as able No amaryl Cont exercise and diet methods Ophtho and foot updated

## 2014-07-15 LAB — MICROALBUMIN / CREATININE URINE RATIO
Creatinine, Urine: 166 mg/dL
MICROALB UR: 6.1 mg/dL — AB (ref <2.0)
Microalb Creat Ratio: 36.7 mg/g — ABNORMAL HIGH (ref 0.0–30.0)

## 2014-07-20 ENCOUNTER — Telehealth: Payer: Self-pay | Admitting: Family Medicine

## 2014-07-20 DIAGNOSIS — E669 Obesity, unspecified: Principal | ICD-10-CM

## 2014-07-20 DIAGNOSIS — E1169 Type 2 diabetes mellitus with other specified complication: Secondary | ICD-10-CM

## 2014-07-20 NOTE — Telephone Encounter (Signed)
Abnormal random microalbumin/creatinine ratio Will need to repeat 24hr sample to verify [facility], MD

## 2014-07-20 NOTE — Telephone Encounter (Signed)
LM for patient to return call.  Spoke with MD and this doesn't have to be right away.  Just at the patient's convenience.  She can stop by the office to pick up supplies. Jazmin Hartsell,CMA

## 2014-12-03 ENCOUNTER — Other Ambulatory Visit: Payer: Self-pay | Admitting: Family Medicine

## 2015-01-12 ENCOUNTER — Other Ambulatory Visit: Payer: Self-pay | Admitting: Family Medicine

## 2015-01-13 NOTE — Telephone Encounter (Signed)
Metformin refilled  Alyssa A. Lincoln Brigham MD, [facility] Resident PGY-1

## 2015-04-13 ENCOUNTER — Telehealth: Payer: Self-pay | Admitting: *Deleted

## 2015-04-13 ENCOUNTER — Encounter: Payer: Self-pay | Admitting: Family Medicine

## 2015-04-13 ENCOUNTER — Ambulatory Visit (INDEPENDENT_AMBULATORY_CARE_PROVIDER_SITE_OTHER): Payer: 59 | Admitting: Family Medicine

## 2015-04-13 VITALS — BP 140/93 | HR 93 | Temp 98.3°F | Ht 68.0 in | Wt 297.0 lb

## 2015-04-13 DIAGNOSIS — H60391 Other infective otitis externa, right ear: Secondary | ICD-10-CM

## 2015-04-13 DIAGNOSIS — R300 Dysuria: Secondary | ICD-10-CM

## 2015-04-13 LAB — POCT URINALYSIS DIPSTICK
Bilirubin, UA: NEGATIVE
Glucose, UA: >=1000
KETONES UA: NEGATIVE
Leukocytes, UA: NEGATIVE
Nitrite, UA: NEGATIVE
PH UA: 5.5
PROTEIN UA: 30
Spec Grav, UA: 1.01
Urobilinogen, UA: 0.2

## 2015-04-13 LAB — POCT UA - MICROSCOPIC ONLY

## 2015-04-13 MED ORDER — FLUCONAZOLE 150 MG PO TABS: 150.0000 mg | ORAL_TABLET | Freq: Every day | ORAL | Status: DC

## 2015-04-13 MED ORDER — CIPROFLOXACIN-DEXAMETHASONE 0.3-0.1 % OT SUSP: 4.0000 [drp] | Freq: Two times a day (BID) | OTIC | Status: DC

## 2015-04-13 MED ORDER — NEOMYCIN-POLYMYXIN-HC 1 % OT SOLN: 3.0000 [drp] | Freq: Four times a day (QID) | OTIC | Status: DC

## 2015-04-13 NOTE — Progress Notes (Signed)
Patient ID: Tara Campos, female   DOB: 22-Dec-1965, 49 y.o.   MRN: [id number]    Subjective: CC:ear pain and concerns for UTI HPI: Patient is a 49 y.o. female with presenting to clinic today for a same day appt for both ear pain and concerns of UTI.  Right ear pain that began yesterday. The pain is sharp in nature and constant. Putting the phone up to her ear made it worse.  Aleve didn't help too much. She also endorses her ear feels clogged with pressure, No drainage noted.  Sounds are muffled. No fever, nasal congestion, cough.  Endorses watery eyes, runny nose, sneezing, some R sided TMJ Everyone has been sick recently at her work.   Concerns of UTI:  Urinary frequency, dysuria, urinary urgency for 3-4 days. Some blood when she wipes that was pink (thought maybe scratched herself). No vaginal discharge. Patient is s/p ablation therefore not having menses. No back pain, N/V  Social History: former smoker   ROS: All other systems reviewed and are negative.  Past Medical History Patient Active Problem List   Diagnosis Date Noted  . Dysuria 04/16/2015  . Otitis, externa, infective 04/16/2015  . Diabetes mellitus without complication 09/47/0962  . Vaginal discomfort 08/20/2013  . Migraine headache 04/12/2012  . Anemia 09/28/2011  . FIBROIDS, UTERUS 08/23/2010  . OBESITY, NOS 10/11/2006  . HYPERTENSION, BENIGN SYSTEMIC 10/11/2006    Medications- reviewed and updated Current Outpatient Prescriptions  Medication Sig Dispense Refill  . ciprofloxacin-dexamethasone (CIPRODEX) otic suspension Place 4 drops into the right ear 2 (two) times daily. 7.5 mL 0  . fluconazole (DIFLUCAN) 150 MG tablet Take 1 tablet (150 mg total) by mouth daily. 1 tablet 1  . metFORMIN (GLUCOPHAGE-XR) 500 MG 24 hr tablet TAKE 1 TABLET BY MOUTH DAILY WITH BREAKFAST 30 tablet 3  . Multiple Vitamins-Minerals (MULTIVITAMIN WITH MINERALS) tablet Take 1 tablet by mouth daily.    .  NEOMYCIN-POLYMYXIN-HYDROCORTISONE (CORTISPORIN) 1 % SOLN otic solution Place 3 drops into the right ear 4 (four) times daily. 10 mL 0  . SUMAtriptan (IMITREX) 50 MG tablet TAKE 1 TABLET BY MOUTH BI-WEEKLY AS NEEDED FOR HEADACHE. MAY REPEAT ONCE IN 2 HOURS IF NEEDED 10 tablet 1   No current facility-administered medications for this visit.    Objective: Office vital signs reviewed. BP 140/93 mmHg  Pulse 93  Temp(Src) 98.3 F (36.8 C) (Oral)  Ht 5\' 8"  (1.727 m)  Wt 297 lb (134.718 kg)  BMI 45.17 kg/m2   Physical Examination:  General: Awake, alert, well- nourished, NAD ENMT:  R TM with some grey discoloration. Intact without bulging noted. Tenderness with touching tragus. L TM intact, normal light reflex, no erythema, no bulging. Nasal turbinates moist. MMM, Oropharynx clear without erythema or tonsillar exudate/hypertrophy Eyes: Conjunctiva non-injected. PERRL.  Cardio: RRR, no m/r/g noted.  Pulm: No increased WOB.  CTAB, without wheezes, rhonchi or crackles noted.  GI: soft, NT/ND,+BS x4, no hepatomegaly, no splenomegaly  U/A: large glucose, neg LE and nitrite, large RBC, few yeast.   Assessment/Plan: Dysuria Patient noted to have yeast infection from U/A. No evidence of UTI. Some RBCs in urine, possibly due to irritation from vulvovaginitis. No CVA tenderness, abdominal pain, N/V concerning for kidney stones. Patient noted to have >1000 glucose in urine. From reviewing this look consistent for her.  - Rx Diflucan 150mg  with 1 refill - Return to clinic precautions discussed.   Otitis, externa, infective Patient's history and exam consistent with otitis externa. No perforation noted on  exam. No systemic symptoms.  - Rx for ciprodex to instill into R ear twice daily  - Return to clinic precautions discussed: worsening pain, fevers, change in hearing, drainage, swelling, difficulty swallowing due to TMJ    Orders Placed This Encounter  Procedures  . POCT Urinalysis Dipstick  .  POCT UA - Microscopic Only    Meds ordered this encounter  Medications  . ciprofloxacin-dexamethasone (CIPRODEX) otic suspension    Sig: Place 4 drops into the right ear 2 (two) times daily.    Dispense:  7.5 mL    Refill:  0  . fluconazole (DIFLUCAN) 150 MG tablet    Sig: Take 1 tablet (150 mg total) by mouth daily.    Dispense:  1 tablet    Refill:  [place] PGY-2, [place]

## 2015-04-13 NOTE — Telephone Encounter (Signed)
Changed to Cortisporin otic.   Thanks, Archie Patten, MD [facility] Resident  04/13/2015, 12:00 PM

## 2015-04-13 NOTE — Patient Instructions (Signed)
Ciprofloxacin; Dexamethasone ear suspension What is this medicine? CIPROFLOXACIN; DEXAMETHASONE (sip roe FLOX a sin; dex a METH a sone) is used to treat ear infections. It also stops the swelling and itching caused by the infection. This medicine may be used for other purposes; ask your health care provider or pharmacist if you have questions. COMMON BRAND NAME(S): Ciprodex Otic What should I tell my health care provider before I take this medicine? They need to know if you have any of these conditions: -any other active infection -viral ear infection -an unusual or allergic reaction to ciprofloxacin; dexamethasone, other medicines, foods, dyes, or preservatives -pregnant or trying to get pregnant -breast-feeding How should I use this medicine? This medicine is only for use in the ears. Wash your hands with soap and water. Do not insert any object or swab into the ear canal. Gently warm the bottle by holding it in the hand for 1 to 2 minutes. Shake the bottle immediately before using. Lie down on your side with the affected ear up. Try not to touch the tip of the dropper to your ear, fingertips, or other surface. Squeeze the bottle gently to put the prescribed number of drops in the ear canal. Stay in this position for 30 to 60 seconds to help the drops soak into the ear. Repeat the steps for the other ear if both ears are infected. Do not use your medicine more often than directed. Finish the full course of medicine prescribed by your doctor or health care professional even if you think your condition is better. Talk to your pediatrician regarding the use of this medicine in children. While this drug may be prescribed for children as young as in children 40 months of age and older for selected conditions, precautions do apply. Overdosage: If you think you have taken too much of this medicine contact a poison control center or emergency room at once. NOTE: This medicine is only for you. Do not share  this medicine with others. What if I miss a dose? If you miss a dose, use it as soon as you can. If it is almost time for your next dose, use only that dose. Do not use double or extra doses. What may interact with this medicine? Interactions are not expected. Do not use any other ear products without telling your doctor or health care professional. This list may not describe all possible interactions. Give your health care provider a list of all the medicines, herbs, non-prescription drugs, or dietary supplements you use. Also tell them if you smoke, drink alcohol, or use illegal drugs. Some items may interact with your medicine. What should I watch for while using this medicine? Tell your doctor or health care professional if your ear infection does not get better in a few days. If rash or allergic reaction occurs, stop using immediately and contact your doctor or health care professional. It is important that you keep the infected ear(s) clean and dry. When bathing, try not to get the infected ear(s) wet. Do not go swimming unless your doctor or health care professional has told you otherwise. To prevent the spread of infection, do not share ear products or share towels and washcloths with anyone else. What side effects may I notice from receiving this medicine? Side effects that you should report to your doctor or health care professional as soon as possible: -allergic reactions like skin rash, itching or hives, swelling of the face, lips, or tongue -burning, itching, and redness -worsening ear  pain Side effects that usually do not require medical attention (report to your doctor or health care professional if they continue or are bothersome): -abnormal feeling in the ear -headache -unpleasant feeling while putting the drops in the ear This list may not describe all possible side effects. Call your doctor for medical advice about side effects. You may report side effects to FDA at  1-800-FDA-1088. Where should I keep my medicine? Keep out of the reach of children. Store at room temperature between 15 and 30 degrees C (59 and 86 degrees F). Do not freeze. Protect from light. Throw away any unused medicine after the expiration date. NOTE: This sheet is a summary. It may not cover all possible information. If you have questions about this medicine, talk to your doctor, pharmacist, or health care provider.  2015, Elsevier/Gold Standard. (2007-10-16 10:33:06)

## 2015-04-13 NOTE — Telephone Encounter (Signed)
Pharmacist with [facility] called stating the Ciprodex has a $75 co-pay.  He suggested to change to Cortisporin otic with a $4 co-pay.  If ok with the change please send new Rx.  Derl Barrow, RN

## 2015-04-16 ENCOUNTER — Telehealth: Payer: Self-pay | Admitting: *Deleted

## 2015-04-16 ENCOUNTER — Encounter: Payer: Self-pay | Admitting: Family Medicine

## 2015-04-16 DIAGNOSIS — R300 Dysuria: Secondary | ICD-10-CM | POA: Insufficient documentation

## 2015-04-16 DIAGNOSIS — H60399 Other infective otitis externa, unspecified ear: Secondary | ICD-10-CM | POA: Insufficient documentation

## 2015-04-16 NOTE — Telephone Encounter (Signed)
Patient called this morning stating she is having vaginal pain.  She has boils in her vaginal area. Patient was seen in clinic on 04/13/15 by Dr. Lorenso Courier.  Patient is not able to make an appointment today; working in [place].  Patient is requesting antibiotics to help with pain and boils.  Patient stated she was told that she had blood in her urine but nothing was prescribed for a bacteria infection.  Please advise.  Derl Barrow, RN

## 2015-04-16 NOTE — Assessment & Plan Note (Signed)
Patient noted to have yeast infection from U/A. No evidence of UTI. Some RBCs in urine, possibly due to irritation from vulvovaginitis. No CVA tenderness, abdominal pain, N/V concerning for kidney stones. Patient noted to have >1000 glucose in urine. From reviewing this look consistent for her.  - Rx Diflucan 150mg  with 1 refill - Return to clinic precautions discussed.

## 2015-04-16 NOTE — Telephone Encounter (Signed)
Precept with Dr. Ree Kida, review patient's office note from 04/13/15.  Dr. Lorenso Courier treated patient for yeast in her urine and Diflucan tablet was given.  Patient is having a new problem now with the boils in vaginal area.  Patient should be seen by a provider just in case the boils needed to lanced.  Patient informed that she should be seen by a provider to evaluate the boils and pain.  She should go to the urgent care or ED department in [place].  Patient stated understanding.  Derl Barrow, RN

## 2015-04-16 NOTE — Assessment & Plan Note (Signed)
Patient's history and exam consistent with otitis externa. No perforation noted on exam. No systemic symptoms.  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

## 2015-04-21 ENCOUNTER — Ambulatory Visit (INDEPENDENT_AMBULATORY_CARE_PROVIDER_SITE_OTHER): Payer: 59 | Admitting: Emergency Medicine

## 2015-04-21 VITALS — BP 148/90 | HR 90 | Temp 98.8°F | Resp 18 | Ht 68.0 in | Wt 295.0 lb

## 2015-04-21 DIAGNOSIS — E669 Obesity, unspecified: Secondary | ICD-10-CM

## 2015-04-21 DIAGNOSIS — L0390 Cellulitis, unspecified: Secondary | ICD-10-CM | POA: Diagnosis not present

## 2015-04-21 DIAGNOSIS — R739 Hyperglycemia, unspecified: Secondary | ICD-10-CM

## 2015-04-21 DIAGNOSIS — E1169 Type 2 diabetes mellitus with other specified complication: Secondary | ICD-10-CM

## 2015-04-21 DIAGNOSIS — E119 Type 2 diabetes mellitus without complications: Secondary | ICD-10-CM

## 2015-04-21 DIAGNOSIS — L0291 Cutaneous abscess, unspecified: Secondary | ICD-10-CM | POA: Diagnosis not present

## 2015-04-21 LAB — POCT CBC
GRANULOCYTE PERCENT: 78.5 % (ref 37–80)
HEMATOCRIT: 41.5 % (ref 37.7–47.9)
HEMOGLOBIN: 13.3 g/dL (ref 12.2–16.2)
Lymph, poc: 1.7 (ref 0.6–3.4)
MCH, POC: 24.2 pg — AB (ref 27–31.2)
MCHC: 32.1 g/dL (ref 31.8–35.4)
MCV: 75.2 fL — AB (ref 80–97)
MID (cbc): 0.4 (ref 0–0.9)
MPV: 9 fL (ref 0–99.8)
PLATELET COUNT, POC: 308 10*3/uL (ref 142–424)
POC Granulocyte: 7.5 — AB (ref 2–6.9)
POC LYMPH %: 17.4 % (ref 10–50)
POC MID %: 4.1 %M (ref 0–12)
RBC: 5.51 M/uL — AB (ref 4.04–5.48)
RDW, POC: 15 %
WBC: 9.5 10*3/uL (ref 4.6–10.2)

## 2015-04-21 LAB — GLUCOSE, POCT (MANUAL RESULT ENTRY): POC GLUCOSE: 407 mg/dL — AB (ref 70–99)

## 2015-04-21 MED ORDER — CEFTRIAXONE SODIUM 1 G IJ SOLR
1000.0000 mg | Freq: Once | INTRAMUSCULAR | Status: AC
  Administered 2015-04-21: 1000 mg via INTRAMUSCULAR

## 2015-04-21 MED ORDER — CLINDAMYCIN HCL 300 MG PO CAPS: 300.0000 mg | ORAL_CAPSULE | Freq: Three times a day (TID) | ORAL | Status: DC

## 2015-04-21 MED ORDER — CEFTRIAXONE SODIUM 1 G IJ SOLR: 1.0000 g | Freq: Once | INTRAMUSCULAR | Status: DC

## 2015-04-21 MED ORDER — CEFTRIAXONE SODIUM 250 MG IJ SOLR: 1000.0000 mg | Freq: Once | INTRAMUSCULAR | Status: DC

## 2015-04-21 NOTE — Patient Instructions (Signed)

## 2015-04-21 NOTE — Progress Notes (Addendum)
Patient ID: Tara Campos, female   DOB: Oct 03, 1965, 49 y.o.   MRN: [id number]    This chart was scribed for Darlyne Russian, MD by Ladene Artist, ED Scribe. The patient was seen in room 11. Patient's care was started at 11:08 AM.   Chief Complaint:  Chief Complaint  Patient presents with  . Recurrent Skin Infections    boils groin area over a week now    HPI: Tara Campos is a 49 y.o. female, with a h/o HTN and DM, who reports to [facility] today complaining of gradually spreading painful abscesses to the groin area for the past week. Pt reports that pain is exacerbated with palpation. She further reports that the abscesses began draining a few days ago. She has also noticed the formation of a new abscess to her right axilla for the past 2 days that has improved with Aleve. She reports h/o same approximately 10 years ago. No previous diagnosis of hidradenitis suppurativa or h/o MRSA. No known medical allergies.   Diabetes Pt states that her blood glucose has been controlled via 500 mg metformin for a while. Pt is followed by her PCP Marina Goodell, MD with [facility]; she was last seen on 8/30.  Past Medical History  Diagnosis Date  . Hypertension   . Uterine bleeding, dysfunctional   . Migraine   . Anemia   . Diabetes mellitus without complication   . Allergy    Past Surgical History  Procedure Laterality Date  . Endometrial ablation  March 2012    For DUB, now resolved  . Cesarean section      X3   Social History   Social History  . Marital Status: Divorced    Spouse Name: N/A  . Number of Children: N/A  . Years of Education: N/A   Social History Main Topics  . Smoking status: Former Smoker    Quit date: 11/29/2010  . Smokeless tobacco: Never Used  . Alcohol Use: No  . Drug Use: No  . Sexual Activity: Not Asked   Other Topics Concern  . 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 Hypothyroidism Mother   . Heart disease Mother 60    MI  . Diabetes Mother   . Hypertension Mother   . Hypothyroidism Sister   . Heart disease Father   . Diabetes Father   . Hypertension Father   . Hyperlipidemia Brother    Allergies  Allergen Reactions  . Betadine [Povidone Iodine] Other (See Comments)    Blisters   Prior to Admission medications   Medication Sig Start Date End Date Taking? Authorizing Provider  ciprofloxacin-dexamethasone (CIPRODEX) otic suspension Place 4 drops into the right ear 2 (two) times daily. 04/13/15  Yes Archie Patten, MD  fluconazole (DIFLUCAN) 150 MG tablet Take 1 tablet (150 mg total) by mouth daily. 04/13/15  Yes Archie Patten, MD  metFORMIN (GLUCOPHAGE-XR) 500 MG 24 hr tablet TAKE 1 TABLET BY MOUTH DAILY WITH BREAKFAST 01/13/15  Yes Alyssa A Lincoln Brigham, MD  Multiple Vitamins-Minerals (MULTIVITAMIN WITH MINERALS) tablet Take 1 tablet by mouth daily.   Yes Historical Provider, MD  NEOMYCIN-POLYMYXIN-HYDROCORTISONE (CORTISPORIN) 1 % SOLN otic solution Place 3 drops into the right ear 4 (four) times daily. 04/13/15  Yes Archie Patten, MD  SUMAtriptan (IMITREX) 50 MG tablet TAKE 1 TABLET BY MOUTH BI-WEEKLY AS NEEDED FOR HEADACHE. MAY REPEAT ONCE IN 2 HOURS IF NEEDED 12/03/14  Yes Alyssa A Haney, MD     ROS: The patient denies fevers, chills, night sweats, unintentional weight loss, chest pain, palpitations, wheezing, dyspnea on exertion, nausea, vomiting, abdominal pain, dysuria, hematuria, melena, numbness, weakness, or tingling. + abscesses  All other systems have been reviewed and were otherwise negative with the exception of those mentioned in the HPI and as above.    PHYSICAL EXAM: Filed Vitals:   04/21/15 1102  BP: 148/90  Pulse: 90  Temp: 98.8 F (37.1 C)  Resp: 18   Body mass index is 44.86 kg/(m^2).   General: Alert, no acute distress HEENT:   Normocephalic, atraumatic, oropharynx patent. Eye: Juliette Mangle [facility] Cardiovascular:  Regular rate and rhythm, no rubs murmurs or gallops.  No Carotid bruits, radial pulse intact. No pedal edema.  Respiratory: Clear to auscultation bilaterally.  No wheezes, rales, or rhonchi.  No cyanosis, no use of accessory musculature Abdominal: No organomegaly, abdomen is soft and non-tender, positive bowel sounds. 1 x 2 cm fluctuant mass at the crease of her panniculus lower abdomen on the L with purulent drainage.  Musculoskeletal: Gait intact. No edema, tenderness Skin: No rashes. Healed areas of hydradenitis in both axilla. Neurologic: Facial musculature symmetric. Psychiatric: Patient acts appropriately throughout our interaction. Lymphatic: No cervical or submandibular lymphadenopathy Genitourinary/Anorectal: Superficial ulcers over her L labia majora just below the labia majora is an egg-sized indurated mass which is tender to touch.   LABS: Results for orders placed or performed in visit on 04/21/15  POCT CBC  Result Value Ref Range   WBC 9.5 4.6 - 10.2 K/uL   Lymph, poc 1.7 0.6 - 3.4   POC LYMPH PERCENT 17.4 10 - 50 %L   MID (cbc) 0.4 0 - 0.9   POC MID % 4.1 0 - 12 %M   POC Granulocyte 7.5 (A) 2 - 6.9   Granulocyte percent 78.5 37 - 80 %G   RBC 5.51 (A) 4.04 - 5.48 M/uL   Hemoglobin 13.3 12.2 - 16.2 g/dL   HCT, POC 41.5 37.7 - 47.9 %   MCV 75.2 (A) 80 - 97 fL   MCH, POC 24.2 (A) 27 - 31.2 pg   MCHC 32.1 31.8 - 35.4 g/dL   RDW, POC 15.0 %   Platelet Count, POC 308 142 - 424 K/uL   MPV 9.0 0 - 99.8 fL  POCT glucose (manual entry)  Result Value Ref Range   POC Glucose 407 (A) 70 - 99 mg/dl     EKG/XRAY:   Primary read interpreted by Dr. Everlene Farrier at [facility].   ASSESSMENT/PLAN: She had an abscess lower abdomen. There was a firm area beneath her labia majora which I did not feel was ready for I&D. She is placed on antibiotics. She does have evidence of hidradenitis. She had ulcerations over her  left labia so an HIV, RPR, and HIVtesting were  done.  her last A1c was 6.1 in December 2015. Her random sugar today was 407. She was instructed to increase her metformin to twice a day. She will need a follow-up with her PCP. Recheck here in 48 hours. She will be on clindamycin 303 times a day.  Gross sideeffects, risk and benefits, and alternatives of medications d/w patient. Patient is aware that all medications have potential sideeffects and we are unable to predict every sideeffect or drug-drug interaction that may  occur.  Arlyss Queen MD 04/21/2015 12:14 PM

## 2015-04-22 LAB — HIV ANTIBODY (ROUTINE TESTING W REFLEX): HIV: NONREACTIVE

## 2015-04-22 LAB — RPR

## 2015-04-23 ENCOUNTER — Ambulatory Visit (INDEPENDENT_AMBULATORY_CARE_PROVIDER_SITE_OTHER): Payer: 59 | Admitting: Emergency Medicine

## 2015-04-23 ENCOUNTER — Encounter (HOSPITAL_COMMUNITY): Payer: Self-pay

## 2015-04-23 ENCOUNTER — Emergency Department (HOSPITAL_COMMUNITY)
Admission: EM | Admit: 2015-04-23 | Discharge: 2015-04-23 | Disposition: A | Discharge status: Home / Self Care | Payer: 59 | Attending: Emergency Medicine | Admitting: Emergency Medicine

## 2015-04-23 VITALS — BP 130/90 | HR 97 | Temp 98.9°F | Resp 18

## 2015-04-23 DIAGNOSIS — L03315 Cellulitis of perineum: Secondary | ICD-10-CM | POA: Insufficient documentation

## 2015-04-23 DIAGNOSIS — Z862 Personal history of diseases of the blood and blood-forming organs and certain disorders involving the immune mechanism: Secondary | ICD-10-CM | POA: Diagnosis not present

## 2015-04-23 DIAGNOSIS — L0390 Cellulitis, unspecified: Secondary | ICD-10-CM

## 2015-04-23 DIAGNOSIS — L0291 Cutaneous abscess, unspecified: Secondary | ICD-10-CM

## 2015-04-23 DIAGNOSIS — E119 Type 2 diabetes mellitus without complications: Secondary | ICD-10-CM | POA: Insufficient documentation

## 2015-04-23 DIAGNOSIS — Z8742 Personal history of other diseases of the female genital tract: Secondary | ICD-10-CM | POA: Insufficient documentation

## 2015-04-23 DIAGNOSIS — Z79899 Other long term (current) drug therapy: Secondary | ICD-10-CM | POA: Diagnosis not present

## 2015-04-23 DIAGNOSIS — E669 Obesity, unspecified: Secondary | ICD-10-CM

## 2015-04-23 DIAGNOSIS — Z792 Long term (current) use of antibiotics: Secondary | ICD-10-CM | POA: Diagnosis not present

## 2015-04-23 DIAGNOSIS — R739 Hyperglycemia, unspecified: Secondary | ICD-10-CM | POA: Diagnosis not present

## 2015-04-23 DIAGNOSIS — G43909 Migraine, unspecified, not intractable, without status migrainosus: Secondary | ICD-10-CM | POA: Diagnosis not present

## 2015-04-23 DIAGNOSIS — Z87891 Personal history of nicotine dependence: Secondary | ICD-10-CM | POA: Diagnosis not present

## 2015-04-23 DIAGNOSIS — L02215 Cutaneous abscess of perineum: Secondary | ICD-10-CM | POA: Diagnosis present

## 2015-04-23 DIAGNOSIS — Z4801 Encounter for change or removal of surgical wound dressing: Secondary | ICD-10-CM

## 2015-04-23 DIAGNOSIS — E1169 Type 2 diabetes mellitus with other specified complication: Secondary | ICD-10-CM

## 2015-04-23 DIAGNOSIS — I10 Essential (primary) hypertension: Secondary | ICD-10-CM | POA: Insufficient documentation

## 2015-04-23 LAB — POCT CBC
GRANULOCYTE PERCENT: 73.4 % (ref 37–80)
HEMATOCRIT: 41.2 % (ref 37.7–47.9)
Hemoglobin: 12.8 g/dL (ref 12.2–16.2)
LYMPH, POC: 1.7 (ref 0.6–3.4)
MCH: 24 pg — AB (ref 27–31.2)
MCHC: 31.1 g/dL — AB (ref 31.8–35.4)
MCV: 77.2 fL — AB (ref 80–97)
MID (cbc): 0.4 (ref 0–0.9)
MPV: 8.4 fL (ref 0–99.8)
POC GRANULOCYTE: 5.7 (ref 2–6.9)
POC LYMPH PERCENT: 21.8 %L (ref 10–50)
POC MID %: 4.8 %M (ref 0–12)
Platelet Count, POC: 328 10*3/uL (ref 142–424)
RBC: 5.34 M/uL (ref 4.04–5.48)
RDW, POC: 14.6 %
WBC: 7.7 10*3/uL (ref 4.6–10.2)

## 2015-04-23 LAB — GLUCOSE, POCT (MANUAL RESULT ENTRY): POC Glucose: 358 mg/dl — AB (ref 70–99)

## 2015-04-23 LAB — I-STAT CHEM 8, ED
BUN: 4 mg/dL — ABNORMAL LOW (ref 6–20)
Calcium, Ion: 1.14 mmol/L (ref 1.12–1.23)
Chloride: 101 mmol/L (ref 101–111)
Creatinine, Ser: 0.7 mg/dL (ref 0.44–1.00)
Glucose, Bld: 285 mg/dL — ABNORMAL HIGH (ref 65–99)
HCT: 41 % (ref 36.0–46.0)
Hemoglobin: 13.9 g/dL (ref 12.0–15.0)
Potassium: 3.5 mmol/L (ref 3.5–5.1)
Sodium: 139 mmol/L (ref 135–145)
TCO2: 25 mmol/L (ref 0–100)

## 2015-04-23 LAB — HSV(HERPES SIMPLEX VRS) I + II AB-IGG
HSV 1 Glycoprotein G Ab, IgG: 8.18 IV — ABNORMAL HIGH
HSV 2 GLYCOPROTEIN G AB, IGG: 9.11 IV — AB

## 2015-04-23 LAB — CBG MONITORING, ED: GLUCOSE-CAPILLARY: 297 mg/dL — AB (ref 65–99)

## 2015-04-23 MED ORDER — LIDOCAINE HCL 1 % IJ SOLN
30.0000 mL | Freq: Once | INTRAMUSCULAR | Status: AC
  Administered 2015-04-23: 20 mL via INTRADERMAL
  Filled 2015-04-23: qty 40

## 2015-04-23 NOTE — ED Notes (Signed)
CBG 297

## 2015-04-23 NOTE — ED Notes (Signed)
Dressing applied to buttocks.  

## 2015-04-23 NOTE — ED Notes (Signed)
Pt had abscess on buttocks lanced on Wednesday.  Pt had another one but they did not do anything to it b/c of size.  Pt went back today and site has increased so they sent her to [place].  Pt has had hx of the same.

## 2015-04-23 NOTE — Patient Instructions (Signed)
Please go to [facility] for I&D of left lower labia abscess.    Abscess An abscess is an infected area that contains a collection of pus and debris.It can occur in almost any part of the body. An abscess is also known as a furuncle or boil. CAUSES  An abscess occurs when tissue gets infected. This can occur from blockage of oil or sweat glands, infection of hair follicles, or a minor injury to the skin. As the body tries to fight the infection, pus collects in the area and creates pressure under the skin. This pressure causes pain. People with weakened immune systems have difficulty fighting infections and get certain abscesses more often.  SYMPTOMS Usually an abscess develops on the skin and becomes a painful mass that is red, warm, and tender. If the abscess forms under the skin, you may feel a moveable soft area under the skin. Some abscesses break open (rupture) on their own, but most will continue to get worse without care. The infection can spread deeper into the body and eventually into the bloodstream, causing you to feel ill.  DIAGNOSIS  Your caregiver will take your medical history and perform a physical exam. A sample of fluid may also be taken from the abscess to determine what is causing your infection. TREATMENT  Your caregiver may prescribe antibiotic medicines to fight the infection. However, taking antibiotics alone usually does not cure an abscess. Your caregiver may need to make a small cut (incision) in the abscess to drain the pus. In some cases, gauze is packed into the abscess to reduce pain and to continue draining the area. HOME CARE INSTRUCTIONS   Only take over-the-counter or prescription medicines for pain, discomfort, or fever as directed by your caregiver.  If you were prescribed antibiotics, take them as directed. Finish them even if you start to feel better.  If gauze is used, follow your caregiver's directions for changing the gauze.  To avoid spreading the  infection:  Keep your draining abscess covered with a bandage.  Wash your hands well.  Do not share personal care items, towels, or whirlpools with others.  Avoid skin contact with others.  Keep your skin and clothes clean around the abscess.  Keep all follow-up appointments as directed by your caregiver. SEEK MEDICAL CARE IF:   You have increased pain, swelling, redness, fluid drainage, or bleeding.  You have muscle aches, chills, or a general ill feeling.  You have a fever. MAKE SURE YOU:   Understand these instructions.  Will watch your condition.  Will get help right away if you are not doing well or get worse. Document Released: 05/10/2005 Document Revised: 01/30/2012 Document Reviewed: 10/13/2011 [facility] Patient Information 2015 [place], [place]. This information is not intended to replace advice given to you by your health care provider. Make sure you discuss any questions you have with your health care provider.

## 2015-04-23 NOTE — ED Provider Notes (Signed)
CSN: [id number]     Arrival date & time 04/23/15  1322 History   First MD Initiated Contact with Patient 04/23/15 1422     Chief Complaint  Patient presents with  . Abscess     (Consider location/radiation/quality/duration/timing/severity/associated sxs/prior Treatment) HPI   49 y.o. female with PMH significant for DM2, HTN, obesity who is presented for wound care of a suprapubic abscess at  [facility] that underwent I&D on 9/7. At last visit she was also noted to have tenderness and induration of her left labia majora. No fluctuance over that area at that time so did not undergo I&D. She was given 1 gm rocephin and placed on cleocin 300 mg TID. She has been taking abx and tolerating well. She has been applying warm compresses. She states the area on her left labia majora is "about the same" and still very tender. At last visit she had a WBC of 9.5 and glucose of 407. She was told to increase her metformin to 500 mg BID from 500 mg QD. She does check her glucose frequently and states usually <150. She has not checked in the past couple days since we have been checking here. She has a PCP Dr. Lincoln Brigham who manages her DM. Last seen by her on 8/30.  She presents today to our ED from urgent care for drainage of an abscess in her left perineal area.  She states the lesion began on Tuesday and has since enlarged and become more painful.  No drainage.  She states she hasn't had an abscess in the past 10 years until last week with her suprapubic abscess.  Denies fever, chills, N/V/D, CP, SOB, abdominal pain, or urinary symptoms.    Past Medical History  Diagnosis Date  . Hypertension   . Uterine bleeding, dysfunctional   . Migraine   . Anemia   . Diabetes mellitus without complication   . Allergy    Past Surgical History  Procedure Laterality Date  . Endometrial ablation  March 2012    For DUB, now resolved  . Cesarean section      X3   Family History  Problem Relation Age  of Onset  . Hypothyroidism Mother   . Heart disease Mother 21    MI  . Diabetes Mother   . Hypertension Mother   . Hypothyroidism Sister   . Heart disease Father   . Diabetes Father   . Hypertension Father   . Hyperlipidemia Brother    Social History  Substance Use Topics  . Smoking status: Former Smoker    Quit date: 11/29/2010  . Smokeless tobacco: Never Used  . Alcohol Use: No   OB History    Gravida Para Term Preterm AB TAB SAB Ectopic Multiple Living   3 3 3       3      Review of Systems All other systems negative unless otherwise stated in HPI    Allergies  Betadine  Home Medications   Prior to Admission medications   Medication Sig Start Date End Date Taking? Authorizing Provider  ciprofloxacin-dexamethasone (CIPRODEX) otic suspension Place 4 drops into the right ear 2 (two) times daily. 04/13/15   Archie Patten, MD  clindamycin (CLEOCIN) 300 MG capsule Take 1 capsule (300 mg total) by mouth 3 (three) times daily. 04/21/15   Darlyne Russian, MD  fluconazole (DIFLUCAN) 150 MG tablet Take 1 tablet (150 mg total) by mouth daily. 04/13/15   Archie Patten, MD  metFORMIN (GLUCOPHAGE-XR) 500 MG 24 hr tablet TAKE 1 TABLET BY MOUTH DAILY WITH BREAKFAST 01/13/15   Veatrice Bourbon, MD  Multiple Vitamins-Minerals (MULTIVITAMIN WITH MINERALS) tablet Take 1 tablet by mouth daily.    Historical Provider, MD  NEOMYCIN-POLYMYXIN-HYDROCORTISONE (CORTISPORIN) 1 % SOLN otic solution Place 3 drops into the right ear 4 (four) times daily. 04/13/15   Archie Patten, MD  SUMAtriptan (IMITREX) 50 MG tablet TAKE 1 TABLET BY MOUTH BI-WEEKLY AS NEEDED FOR HEADACHE. MAY REPEAT ONCE IN 2 HOURS IF NEEDED 12/03/14   Alyssa A Haney, MD   BP 142/98 mmHg  Pulse 100  Temp(Src) 98.7 F (37.1 C) (Oral)  Resp 18  SpO2 100% Physical Exam  Constitutional: She is oriented to person, place, and time. She appears well-developed and well-nourished.  HENT:  Head: Normocephalic and atraumatic.  Neck:  Normal range of motion. Neck supple.  Cardiovascular: Normal rate, regular rhythm and normal heart sounds.   No murmur heard. Pulmonary/Chest: Effort normal and breath sounds normal. No respiratory distress. She has no wheezes. She has no rales.  Abdominal: Soft. Bowel sounds are normal. She exhibits no distension. There is no tenderness.  Genitourinary:  TTP along left perineum.  Induration along left perineum with erythema and warmth.  1 cm area of fluctuation.  No drainage or pus.  Musculoskeletal: Normal range of motion.  Lymphadenopathy:    She has no cervical adenopathy.  Neurological: She is alert and oriented to person, place, and time.  Skin: Skin is warm and dry.  Psychiatric: She has a normal mood and affect. Her behavior is normal.    ED Course  Procedures (including critical care time)  INCISION AND DRAINAGE Performed by: Gloriann Loan Consent: Verbal consent obtained. Risks and benefits: risks, benefits and alternatives were discussed Type: abscess  Body area: left perineum  Anesthesia: local infiltration  Incision was made with a scalpel.  Local anesthetic: lidocaine 1% without epinephrine  Anesthetic total: 5 ml  Complexity: Simple  Drainage: purulent  Drainage amount: 2 mL  Packing material: None  Patient tolerance: Patient tolerated the procedure well with no immediate complications.    Labs Review Labs Reviewed  CBG MONITORING, ED    Imaging Review No results found. I have personally reviewed and evaluated these images and lab results as part of my medical decision-making.   EKG Interpretation None      MDM   Final diagnoses:  None   Patient presents with left perineal area of induration and small area of fluctuance with surroundnig erythema and warmth.  VSS, patient appears nontoxic, NAD. Labs show CBG 297.  Suspect abscess with overlying cellultis.  I&D performed.  No additional abx given, she received Rocephin IM (9/7) and advised to  continue taking Cleocin 300 mg TID. Pt stable for d/c.  Advised to follow up in 2 days with PCP for wound check.  Patient acknowledges and agrees with the above plan.     Gloriann Loan, PA-C 04/23/15 [place], MD 04/24/15 [id number]

## 2015-04-23 NOTE — Discharge Instructions (Signed)

## 2015-04-23 NOTE — Progress Notes (Signed)
[facility] [address], [place] [address] [phone]  Date:  04/23/2015   Name:  Tara Campos   DOB:  1966-02-23   MRN:  [id number]  PCP:  Marina Goodell, MD    Chief Complaint: Wound Check   History of Present Illness:  This is a 49 y.o. female with PMH DM2, HTN, obesity who is presenting for wound care of a suprapubic abscess that underwent I&D on 9/7. At last visit she was also noted to have tenderness and induration of her left labia majora. No fluctuance over that area at that time so did not undergo I&D. She was given 1 gm rocephin and placed on cleocin 300 mg TID. She has been taking abx and tolerating well. She has been applying warm compresses. She states the area on her left labia majora is "about the same" and still very tender. At last visit she had a WBC of 9.5 and glucose of 407. She was told to increase her metformin to 500 mg BID from 500 mg QD. She does check her glucose frequently and states usually <150. She has not checked in the past couple days since we have been checking here. She has a PCP Dr. Lincoln Brigham who manages her DM. Last seen by her on 8/30.  Review of Systems:  Review of Systems See HPI  Patient Active Problem List   Diagnosis Date Noted  . Dysuria 04/16/2015  . Otitis, externa, infective 04/16/2015  . Diabetes mellitus without complication 02/11/1600  . Vaginal discomfort 08/20/2013  . Migraine headache 04/12/2012  . Anemia 09/28/2011  . FIBROIDS, UTERUS 08/23/2010  . OBESITY, NOS 10/11/2006  . HYPERTENSION, BENIGN SYSTEMIC 10/11/2006    Prior to Admission medications   Medication Sig Start Date End Date Taking? Authorizing Provider  ciprofloxacin-dexamethasone (CIPRODEX) otic suspension Place 4 drops into the right ear 2 (two) times daily. 04/13/15  Yes Archie Patten, MD  clindamycin (CLEOCIN) 300 MG capsule Take 1 capsule (300 mg total) by mouth 3 (three) times daily. 04/21/15  Yes Darlyne Russian, MD  fluconazole  (DIFLUCAN) 150 MG tablet Take 1 tablet (150 mg total) by mouth daily. 04/13/15  Yes Archie Patten, MD  metFORMIN (GLUCOPHAGE-XR) 500 MG 24 hr tablet TAKE 1 TABLET BY MOUTH DAILY WITH BREAKFAST 01/13/15  Yes Alyssa A Lincoln Brigham, MD  Multiple Vitamins-Minerals (MULTIVITAMIN WITH MINERALS) tablet Take 1 tablet by mouth daily.   Yes Historical Provider, MD  NEOMYCIN-POLYMYXIN-HYDROCORTISONE (CORTISPORIN) 1 % SOLN otic solution Place 3 drops into the right ear 4 (four) times daily. 04/13/15  Yes Crystal Libby Maw, MD  SUMAtriptan (IMITREX) 50 MG tablet TAKE 1 TABLET BY MOUTH BI-WEEKLY AS NEEDED FOR HEADACHE. MAY REPEAT ONCE IN 2 HOURS IF NEEDED 12/03/14  Yes Veatrice Bourbon, MD    Allergies  Allergen Reactions  . Betadine [Povidone Iodine] Other (See Comments)    Blisters    Past Surgical History  Procedure Laterality Date  . Endometrial ablation  March 2012    For DUB, now resolved  . Cesarean section      X3    Social History  Substance Use Topics  . Smoking status: Former Smoker    Quit date: 11/29/2010  . Smokeless tobacco: Never Used  . Alcohol Use: No    Family History  Problem Relation Age of Onset  . Hypothyroidism Mother   . Heart disease Mother 76    MI  . Diabetes Mother   . Hypertension Mother   .  Hypothyroidism Sister   . Heart disease Father   . Diabetes Father   . Hypertension Father   . Hyperlipidemia Brother     Medication list has been reviewed and updated.  Physical Examination:  Physical Exam  Constitutional: She is oriented to person, place, and time. She appears well-developed and well-nourished. No distress.  HENT:  Head: Normocephalic and atraumatic.  Right Ear: Hearing normal.  Left Ear: Hearing normal.  Nose: Nose normal.  Eyes: Conjunctivae and lids are normal. Right eye exhibits no discharge. Left eye exhibits no discharge. No scleral icterus.  Pulmonary/Chest: Effort normal. No respiratory distress.  Musculoskeletal: Normal range of motion.   Neurological: She is alert and oriented to person, place, and time.  Skin: Skin is warm and dry.  Dressing and packing removed. 0.75 cm incision in crease of panniculus on L side. Wound irrigated with 3 cc 1% lido without epi. No additional purulence. Still with significant tenderness esp at inferior aspect of wound. Wound depth 1 cm. Wound loosely packed and dressing replaced.  Large area of induration and erythema on left buttock and left labia majora with 2 cm fluctuance lateral to anus. Significantly TTP. No drainage.  Psychiatric: She has a normal mood and affect. Her speech is normal and behavior is normal. Thought content normal.   BP 130/90 mmHg  Pulse 97  Temp(Src) 98.9 F (37.2 C) (Oral)  Resp 18  SpO2 98%  Results for orders placed or performed in visit on 04/23/15  POCT CBC  Result Value Ref Range   WBC 7.7 4.6 - 10.2 K/uL   Lymph, poc 1.7 0.6 - 3.4   POC LYMPH PERCENT 21.8 10 - 50 %L   MID (cbc) 0.4 0 - 0.9   POC MID % 4.8 0 - 12 %M   POC Granulocyte 5.7 2 - 6.9   Granulocyte percent 73.4 37 - 80 %G   RBC 5.34 4.04 - 5.48 M/uL   Hemoglobin 12.8 12.2 - 16.2 g/dL   HCT, POC 41.2 37.7 - 47.9 %   MCV 77.2 (A) 80 - 97 fL   MCH, POC 24.0 (A) 27 - 31.2 pg   MCHC 31.1 (A) 31.8 - 35.4 g/dL   RDW, POC 14.6 %   Platelet Count, POC 328 142 - 424 K/uL   MPV 8.4 0 - 99.8 fL  POCT glucose (manual entry)  Result Value Ref Range   POC Glucose 358 (A) 70 - 99 mg/dl   Assessment and Plan:  1. Cellulitis and abscess 2. Hyperglycemia 3. DM2 in obese Wound care performed for abscess in suprapubic area which is healing well although still significantly tender. Large abscess present on left labia majora/buttock that Dr. Everlene Farrier thinks would be better managed in the ED. They can also help control her hyperglycemia which is lower today compared to 2 days ago (358 vs 407) but still very high. She will head to [facility] and Dr. Everlene Farrier has alerted them of her arrival. Continue abx. Wound  care discussed. Return in 48 hours for wound check. - POCT CBC - POCT glucose (manual entry)    Benjaman Pott. Drenda Freeze, MHS [facility]  04/23/2015

## 2015-04-25 ENCOUNTER — Ambulatory Visit (INDEPENDENT_AMBULATORY_CARE_PROVIDER_SITE_OTHER): Payer: 59 | Admitting: Physician Assistant

## 2015-04-25 VITALS — BP 148/100 | HR 88 | Temp 98.1°F | Resp 16 | Ht 67.5 in | Wt 295.8 lb

## 2015-04-25 DIAGNOSIS — L0390 Cellulitis, unspecified: Secondary | ICD-10-CM

## 2015-04-25 DIAGNOSIS — L0291 Cutaneous abscess, unspecified: Secondary | ICD-10-CM

## 2015-04-25 NOTE — Progress Notes (Signed)
Tara Campos  MRN: [id number] DOB: February 16, 1966  Subjective:  Pt presents to clinic for wound recheck.  She went to the ED and had the perineal abscess opened and feels like that is doing really well. - she has been placing gauze over the wound and it is really not draining that much any more and then swelling is significantly improved.  She is also feeling better with the abd wall abscess - changing the drsg daily and noticing less drainage from the area.  She is tolerating her abx ok.  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  She is sexually active with the same partner for the last 10 years.  Patient Active Problem List   Diagnosis Date Noted  . Dysuria 04/16/2015  . Otitis, externa, infective 04/16/2015  . Diabetes mellitus without complication 23/76/2831  . Vaginal discomfort 08/20/2013  . Migraine headache 04/12/2012  . Anemia 09/28/2011  . FIBROIDS, UTERUS 08/23/2010  . OBESITY, NOS 10/11/2006  . HYPERTENSION, BENIGN SYSTEMIC 10/11/2006    Current Outpatient Prescriptions on File Prior to Visit  Medication Sig Dispense Refill  . clindamycin (CLEOCIN) 300 MG capsule Take 1 capsule (300 mg total) by mouth 3 (three) times daily. 30 capsule 0  . metFORMIN (GLUCOPHAGE-XR) 500 MG 24 hr tablet TAKE 1 TABLET BY MOUTH DAILY WITH BREAKFAST 30 tablet 3  . Multiple Vitamins-Minerals (MULTIVITAMIN WITH MINERALS) tablet Take 1 tablet by mouth daily.    . naproxen sodium (ANAPROX) 220 MG tablet Take 220 mg by mouth 2 (two) times daily as needed (pain).    . NEOMYCIN-POLYMYXIN-HYDROCORTISONE (CORTISPORIN) 1 % SOLN otic solution Place 3 drops into the right ear 4 (four) times daily. 10 mL 0  . SUMAtriptan (IMITREX) 50 MG tablet TAKE 1 TABLET BY MOUTH BI-WEEKLY AS NEEDED FOR HEADACHE. MAY REPEAT ONCE IN 2 HOURS IF NEEDED 10 tablet 1  . ciprofloxacin-dexamethasone (CIPRODEX) otic suspension Place 4 drops into the right ear 2 (two) times daily.  (Patient not taking: Reported on 04/25/2015) 7.5 mL 0  . fluconazole (DIFLUCAN) 150 MG tablet Take 1 tablet (150 mg total) by mouth daily. (Patient not taking: Reported on 04/23/2015) 1 tablet 1   No current facility-administered medications on file prior to visit.    Allergies  Allergen Reactions  . Betadine [Povidone Iodine] Other (See Comments)    Blisters    Review of Systems  Constitutional: Negative for fever and chills.  Gastrointestinal: Negative for nausea.  Skin: Positive for wound. Negative for rash.   Objective:  BP 148/100 mmHg  Pulse 88  Temp(Src) 98.1 F (36.7 C) (Oral)  Resp 16  Ht 5' 7.5" (1.715 m)  Wt 295 lb 12.8 oz (134.174 kg)  BMI 45.62 kg/m2  SpO2 98%  Physical Exam  Constitutional: She is oriented to person, place, and time and well-developed, well-nourished, and in no distress.  HENT:  Head: Normocephalic and atraumatic.  Right Ear: Hearing and external ear normal.  Left Ear: Hearing and external ear normal.  Eyes: Conjunctivae are normal.  Neck: Normal range of motion.  Pulmonary/Chest: Effort normal.  Neurological: She is alert and oriented to person, place, and time. Gait normal.  Skin: Skin is warm and dry.  Left abd wall abscess - skin surrounding the abscess is erythematous where the bandage has been due to skin trauma - the wound packing is pulled and and there is no additional purulence expressed and the wound base is visible and good  granulation tissue is present in the wound cavity.  Left perineal/groin abscess - no packing present.  No erythema present - induration is present but per patient improved - no purulence is expressed   Psychiatric: Mood, memory, affect and judgment normal.  Vitals reviewed.   Assessment and Plan :  Cellulitis and abscess finish abx - continue drsg changes at least daily - no f/u needed unless she has problems.  Abnormal lab test - discussed with patient at length what HSV IgG ab means and what options there  are to deal with the results.  Pt has decided to do nothing at this point.  She uses condoms with every sexual encounter and plans to be with her current partner in the future so she does not wish to start prophylaxis at this time but we discussed that if she has any further questions or concerns she will let me know.  Continue and finish abx.    Windell Hummingbird PA-C  [facility] 04/25/2015 11:27 AM

## 2015-04-26 ENCOUNTER — Encounter: Payer: Self-pay | Admitting: Family Medicine

## 2015-04-26 LAB — WOUND CULTURE: Gram Stain: NONE SEEN

## 2015-04-27 ENCOUNTER — Encounter: Payer: Self-pay | Admitting: Emergency Medicine

## 2015-04-30 ENCOUNTER — Telehealth: Payer: Self-pay

## 2015-04-30 NOTE — Telephone Encounter (Signed)
Spoke to pt and informed of lab results.  

## 2015-06-04 ENCOUNTER — Telehealth: Payer: 59 | Admitting: Family

## 2015-06-04 DIAGNOSIS — B373 Candidiasis of vulva and vagina: Secondary | ICD-10-CM | POA: Diagnosis not present

## 2015-06-04 DIAGNOSIS — B3731 Acute candidiasis of vulva and vagina: Secondary | ICD-10-CM

## 2015-06-04 MED ORDER — FLUCONAZOLE 150 MG PO TABS: 150.0000 mg | ORAL_TABLET | Freq: Once | ORAL | Status: DC

## 2015-06-04 NOTE — Progress Notes (Signed)
We are sorry that you are not feeling well. Here is how we plan to help! Based on what you shared with me it looks like you: May have a yeast vaginosis   *However, due to your unique circumstances with the MRSA infection, you may not have a yeast infection. I will prescribe the Fluconazole as listed below in the event that you do get discharge (common). However, if it worsens with discharge and you take the Fluconazole and it does not work, please immediately see the provider that treated you for the MRSA vaginal infection as this is a condition that needs to be evaluated face-to-face. It may require strong antibiotics.   Vaginosis is an inflammation of the vagina that can result in discharge, itching and pain. The cause is usually a change in the normal balance of vaginal bacteria or an infection. Vaginosis can also result from reduced estrogen levels after menopause.  The most common causes of vaginosis are:   Bacterial vaginosis which results from an overgrowth of one on several organisms that are normally present in your vagina.   Yeast infections which are caused by a naturally occurring fungus called candida.   Vaginal atrophy (atrophic vaginosis) which results from the thinning of the vagina from reduced estrogen levels after menopause.   Trichomoniasis which is caused by a parasite and is commonly transmitted by sexual intercourse.  Factors that increase your risk of developing vaginosis include: Marland Kitchen Medications, such as antibiotics and steroids . Uncontrolled diabetes . Use of hygiene products such as bubble bath, vaginal spray or vaginal deodorant . Douching . Wearing damp or tight-fitting clothing . Using an intrauterine device (IUD) for birth control . Hormonal changes, such as those associated with pregnancy, birth control pills or menopause . Sexual activity . Having a sexually transmitted infection  Your treatment plan is A single Diflucan (fluconazole) 150mg  tablet once.  I  have electronically sent this prescription into the pharmacy that you have chosen.  Be sure to take all of the medication as directed. Stop taking any medication if you develop a rash, tongue swelling or shortness of breath. Mothers who are breast feeding should consider pumping and discarding their breast milk while on these antibiotics. However, there is no consensus that infant exposure at these doses would be harmful.  Remember that medication creams can weaken latex condoms. Marland Kitchen   HOME CARE:  Good hygiene may prevent some types of vaginosis from recurring and may relieve some symptoms:  . Avoid baths, hot tubs and whirlpool spas. Rinse soap from your outer genital area after a shower, and dry the area well to prevent irritation. Don't use scented or harsh soaps, such as those with deodorant or antibacterial action. Marland Kitchen Avoid irritants. These include scented tampons and pads. . Wipe from front to back after using the toilet. Doing so avoids spreading fecal bacteria to your vagina.  Other things that may help prevent vaginosis include:  Marland Kitchen Don't douche. Your vagina doesn't require cleansing other than normal bathing. Repetitive douching disrupts the normal organisms that reside in the vagina and can actually increase your risk of vaginal infection. Douching won't clear up a vaginal infection. . Use a latex condom. Both female and female latex condoms may help you avoid infections spread by sexual contact. . Wear cotton underwear. Also wear pantyhose with a cotton crotch. If you feel comfortable without it, skip wearing underwear to bed. Yeast thrives in [employer] Your symptoms should improve in the next day or two.  GET  HELP RIGHT AWAY IF:  . You have pain in your lower abdomen ( pelvic area or over your ovaries) . You develop nausea or vomiting . You develop a fever . Your discharge changes or worsens . You have persistent pain with intercourse . You develop shortness of breath, a  rapid pulse, or you faint.  These symptoms could be signs of problems or infections that need to be evaluated by a medical provider now.  MAKE SURE YOU    Understand these instructions.  Will watch your condition.  Will get help right away if you are not doing well or get worse.  Your e-visit answers were reviewed by a board certified advanced clinical practitioner to complete your personal care plan. Depending upon the condition, your plan could have included both over the counter or prescription medications. Please review your pharmacy choice to make sure that you have choses a pharmacy that is open for you to pick up any needed prescription, Your safety is important to [place]. If you have drug allergies check your prescription carefully.   You can use MyChart to ask questions about today's visit, request a non-urgent call back, or ask for a work or school excuse for 24 hours related to this e-Visit. If it has been greater than 24 hours you will need to follow up with your provider, or enter a new e-Visit to address those concerns. You will get a MyChart message within the next two days asking about your experience. I hope that your e-visit has been valuable and will speed your recovery.

## 2015-06-16 ENCOUNTER — Other Ambulatory Visit: Payer: Self-pay | Admitting: Emergency Medicine

## 2015-06-16 ENCOUNTER — Emergency Department
Admission: EM | Admit: 2015-06-16 | Discharge: 2015-06-16 | Disposition: A | Discharge status: Home / Self Care | Payer: 59 | Source: Home / Self Care | Attending: Family Medicine | Admitting: Family Medicine

## 2015-06-16 ENCOUNTER — Encounter: Payer: Self-pay | Admitting: *Deleted

## 2015-06-16 DIAGNOSIS — L729 Follicular cyst of the skin and subcutaneous tissue, unspecified: Secondary | ICD-10-CM

## 2015-06-16 DIAGNOSIS — N750 Cyst of Bartholin's gland: Secondary | ICD-10-CM

## 2015-06-16 DIAGNOSIS — L089 Local infection of the skin and subcutaneous tissue, unspecified: Secondary | ICD-10-CM

## 2015-06-16 MED ORDER — HYDROCODONE-ACETAMINOPHEN 5-325 MG PO TABS: ORAL_TABLET | ORAL | Status: DC

## 2015-06-16 MED ORDER — DOXYCYCLINE HYCLATE 100 MG PO CAPS: 100.0000 mg | ORAL_CAPSULE | Freq: Two times a day (BID) | ORAL | Status: DC

## 2015-06-16 NOTE — Discharge Instructions (Signed)
Apply warm compress 3 to 4 times daily.   Bartholin Cyst or Abscess A Bartholin cyst is a fluid-filled sac that forms on a Bartholin gland. Bartholin glands are small glands that are located within the folds of skin (labia) along the sides of the lower opening of the vagina. These glands produce a fluid to moisten the outside of the vagina during sexual intercourse. A Bartholin cyst causes a bulge on the side of the vagina. A cyst that is not large or infected may not cause symptoms or problems. However, if the fluid within the cyst becomes infected, the cyst can turn into an abscess. An abscess may cause discomfort or pain. CAUSES A Bartholin cyst may develop when the duct of the gland becomes blocked. In many cases, the cause of this is not known. Various kinds of bacteria can cause the cyst to become infected and develop into an abscess. RISK FACTORS You may be at an increased risk of developing a Bartholin cyst or abscess if:  You are a woman of reproductive age.  You have a history of previous Bartholin cysts or abscesses.  You have diabetes.  You have a sexually transmitted disease (STD). SIGNS AND SYMPTOMS The severity of symptoms varies depending on the size of the cyst and whether it is infected. Symptoms may include:  A bulge or swelling near the lower opening of your vagina.  Discomfort or pain.  Redness.  Pain during sexual intercourse.  Pain when walking.  Fluid draining from the area. DIAGNOSIS Your health care provider may make a diagnosis based on your symptoms and a physical exam. He or she will look for swelling in your vaginal area. Blood tests may be done to check for infections. A sample of fluid from the cyst or abscess may also be taken to be tested in a lab. TREATMENT Small cysts that are not infected may not require any treatment. These often go away on their own. Yourhealth care provider will recommend hot baths and the use of warm compresses. These may  also be part of the treatment for an abscess. Treatment options for a large cyst or abscess may include:   Antibiotic medicine.  A surgical procedure to drain the abscess. One of the following procedures may be done:  Incision and drainage. An incision is made in the cyst or abscess so that the fluid drains out. A catheter may be placed inside the cyst so that it does not close and fill up with fluid again. The catheter will be removed after you have a follow-up visit with a specialist (gynecologist).  Marsupialization. The cyst or abscess is opened and kept open by stitching the edges of the skin to the walls of the cyst or abscess. This allows it to continue to drain and not fill up with fluid again. If you have cysts or abscesses that keep returning and have required incision and drainage multiple times, your health care provider may talk to you about surgery to remove the Bartholin gland. HOME CARE INSTRUCTIONS  Take medicines only as directed by your health care provider.  If you were prescribed an antibiotic medicine, finish it all even if you start to feel better.  Apply warm, wet compresses to the area or take warm, shallow baths that cover your pelvic region (sitz baths) several times a day or as directed by your health care provider.  Do not squeeze the cyst or apply heavy pressure to it.  Do not have sexual intercourse until the cyst  has gone away.  If your cyst or abscess was opened, a small piece of gauze or a drain may have been placed in the area to allow drainage. Do not remove the gauze or the drain until directed by your health care provider.  Wear feminine pads--not tampons--as needed for any drainage or bleeding.  Keep all follow-up visits as directed by your health care provider. This is important. PREVENTION Take these steps to help prevent a Bartholin cyst from returning:  Practice good hygiene.   Clean your vaginal area with mild soap and a soft cloth when  you bathe.  Practice safe sex to prevent STDs. SEEK MEDICAL CARE IF:  You have increased pain, swelling, or redness in the area of the cyst.  Puslike drainage is coming from the cyst.  You have a fever.   This information is not intended to replace advice given to you by your health care provider. Make sure you discuss any questions you have with your health care provider.   Document Released: 07/31/2005 Document Revised: 08/21/2014 Document Reviewed: 03/16/2014 Elsevier Interactive Patient Education [employer].

## 2015-06-16 NOTE — ED Notes (Signed)
Pt c/o boil to abdomen yesterday AM and one to her labia that started last night. H/0 MRSA in September, multiple boils to labia.

## 2015-06-16 NOTE — ED Provider Notes (Signed)
CSN: [id number]     Arrival date & time 06/16/15  1503 History   First MD Initiated Contact with Patient 06/16/15 1544     Chief Complaint  Patient presents with  . Recurrent Skin Infections     HPI Comments: Patient complains of the appearance of a possible early abscess on her left labia yesterday, and today a sore spot on her left abdomen.  She has a history of abscess (?Bartholin's cyst) in September 2016, with culture positive for MRSA   The history is provided by the patient.    Past Medical History  Diagnosis Date  . Hypertension   . Uterine bleeding, dysfunctional   . Migraine   . Anemia   . Diabetes mellitus without complication ([place])   . Allergy    Past Surgical History  Procedure Laterality Date  . Endometrial ablation  March 2012    For DUB, now resolved  . Cesarean section      X3   Family History  Problem Relation Age of Onset  . Hypothyroidism Mother   . Heart disease Mother 64    MI  . Diabetes Mother   . Hypertension Mother   . Hypothyroidism Sister   . Heart disease Father   . Diabetes Father   . Hypertension Father   . Hyperlipidemia Brother    Social History  Substance Use Topics  . Smoking status: Former Smoker    Quit date: 11/29/2010  . Smokeless tobacco: Never Used  . Alcohol Use: No   OB History    Gravida Para Term Preterm AB TAB SAB Ectopic Multiple Living   3 3 3       3      Review of Systems  Constitutional: Negative for fever, chills, diaphoresis, activity change and fatigue.  Genitourinary: Positive for genital sores.  Skin:       Small painful lesion on left abdomen  All other systems reviewed and are negative.   Allergies  Betadine  Home Medications   Prior to Admission medications   Medication Sig Start Date End Date Taking? Authorizing Provider  metFORMIN (GLUCOPHAGE-XR) 500 MG 24 hr tablet TAKE 1 TABLET BY MOUTH DAILY WITH BREAKFAST 01/13/15  Yes Alyssa A Haney, MD  doxycycline (VIBRAMYCIN) 100 MG capsule Take 1  capsule (100 mg total) by mouth 2 (two) times daily. Take with food. 06/16/15   Kandra Nicolas, MD  HYDROcodone-acetaminophen (NORCO/VICODIN) 5-325 MG tablet Take one by mouth at bedtime as needed for pain 06/16/15   Kandra Nicolas, MD  Multiple Vitamins-Minerals (MULTIVITAMIN WITH MINERALS) tablet Take 1 tablet by mouth daily.    Historical Provider, MD  naproxen sodium (ANAPROX) 220 MG tablet Take 220 mg by mouth 2 (two) times daily as needed (pain).    Historical Provider, MD  SUMAtriptan (IMITREX) 50 MG tablet TAKE 1 TABLET BY MOUTH BI-WEEKLY AS NEEDED FOR HEADACHE. MAY REPEAT ONCE IN 2 HOURS IF NEEDED 12/03/14   Veatrice Bourbon, MD   Meds Ordered and Administered this Visit  Medications - No data to display  BP 153/108 mmHg  Pulse 89  Temp(Src) 98.2 F (36.8 C) (Oral)  Resp 16  Wt 299 lb (135.626 kg)  SpO2 100% No data found.   Physical Exam  Constitutional: She appears well-developed and well-nourished. No distress.  Patient is obese.  HENT:  Head: Normocephalic.  Eyes: Pupils are equal, round, and reactive to light.  Neck: Neck supple.  Cardiovascular: Normal heart sounds.   Pulmonary/Chest: Breath sounds normal.  Abdominal:    Left abdomen has a 1.5cm diameter cystic lesion  as noted on diagram, mildly tender to palpation but not indurated, erythematous, or fluctuant.    Genitourinary:     Left inferior labia has an 63mm diameter area of tenderness and induration as noted on diagram.  The area is not fluctuant and minimally erythematous.    Lymphadenopathy:    She has no cervical adenopathy.  Nursing note and vitals reviewed.   ED Course  Procedures  none  MDM   1. Cyst of left Bartholin's gland   2. Infected cyst of skin    Begin empiric doxycycline 100mg  BID Rx for Lortab at bedtime. Apply warm compress 3 to 4 times daily. Return for I and D if not improving 3 days.    Kandra Nicolas, MD 06/17/15 1032

## 2015-06-20 ENCOUNTER — Telehealth: Payer: Self-pay | Admitting: Emergency Medicine

## 2015-07-02 ENCOUNTER — Other Ambulatory Visit: Payer: Self-pay | Admitting: Student

## 2015-07-02 NOTE — Telephone Encounter (Signed)
Refill request from pharmacy. Will forward to PCP to review. Michon Kaczmarek, CMA.

## 2015-07-05 NOTE — Telephone Encounter (Signed)
Metformin refilled

## 2015-07-07 ENCOUNTER — Encounter: Payer: Self-pay | Admitting: *Deleted

## 2015-07-07 ENCOUNTER — Emergency Department
Admission: EM | Admit: 2015-07-07 | Discharge: 2015-07-07 | Disposition: A | Discharge status: Home / Self Care | Payer: 59 | Source: Home / Self Care | Attending: Family Medicine | Admitting: Family Medicine

## 2015-07-07 DIAGNOSIS — J029 Acute pharyngitis, unspecified: Secondary | ICD-10-CM | POA: Diagnosis not present

## 2015-07-07 DIAGNOSIS — J069 Acute upper respiratory infection, unspecified: Secondary | ICD-10-CM | POA: Diagnosis not present

## 2015-07-07 LAB — POCT RAPID STREP A (OFFICE): Rapid Strep A Screen: NEGATIVE

## 2015-07-07 MED ORDER — BENZONATATE 200 MG PO CAPS: 200.0000 mg | ORAL_CAPSULE | Freq: Every day | ORAL | Status: DC

## 2015-07-07 NOTE — Discharge Instructions (Signed)
Take plain guaifenesin (1200mg  extended release tabs such as Mucinex) twice daily, with plenty of water, for cough and congestion. Get adequate rest.   May use Afrin nasal spray (or generic oxymetazoline) twice daily for about 5 days and then discontinue.  Also recommend using saline nasal spray several times daily and saline nasal irrigation (AYR is a common brand).  Try warm salt water gargles for sore throat.  Stop all antihistamines for now, and other non-prescription cough/cold preparations.   Follow-up with family doctor if not improving about10 days.  Monitor blood pressure; if it remains elevated, follow-up with family doctor next week.

## 2015-07-07 NOTE — ED Notes (Signed)
Pt c/o sore throat and nonproductive cough x 1 day. Denies fever.

## 2015-07-07 NOTE — ED Provider Notes (Signed)
CSN: [id number]     Arrival date & time 07/07/15  1208 History   First MD Initiated Contact with Patient 07/07/15 1242     Chief Complaint  Patient presents with  . Sore Throat      HPI Comments: Patient complains of one day history of typical cold-like symptoms including mild sore throat, sinus congestion, headache, fatigue, and cough.   The history is provided by the patient.    Past Medical History  Diagnosis Date  . Hypertension   . Uterine bleeding, dysfunctional   . Migraine   . Anemia   . Diabetes mellitus without complication ([place])   . Allergy    Past Surgical History  Procedure Laterality Date  . Endometrial ablation  March 2012    For DUB, now resolved  . Cesarean section      X3   Family History  Problem Relation Age of Onset  . Hypothyroidism Mother   . Heart disease Mother 52    MI  . Diabetes Mother   . Hypertension Mother   . Hypothyroidism Sister   . Heart disease Father   . Diabetes Father   . Hypertension Father   . Hyperlipidemia Brother    Social History  Substance Use Topics  . Smoking status: Former Smoker    Quit date: 11/29/2010  . Smokeless tobacco: Never Used  . Alcohol Use: No   OB History    Gravida Para Term Preterm AB TAB SAB Ectopic Multiple Living   3 3 3       3      Review of Systems + sore throat + cough No pleuritic pain No wheezing + nasal congestion + post-nasal drainage No sinus pain/pressure No itchy/red eyes No earache No hemoptysis No SOB No fever, + chills No nausea No vomiting No abdominal pain No diarrhea No urinary symptoms No skin rash + fatigue + myalgias + headache Used OTC meds without relief  Allergies  Betadine  Home Medications   Prior to Admission medications   Medication Sig Start Date End Date Taking? Authorizing Provider  benzonatate (TESSALON) 200 MG capsule Take 1 capsule (200 mg total) by mouth at bedtime. Take as needed for cough 07/07/15   Kandra Nicolas, MD  metFORMIN  (GLUCOPHAGE-XR) 500 MG 24 hr tablet TAKE 1 TABLET BY MOUTH DAILY WITH BREAKFAST 07/05/15   Veatrice Bourbon, MD  Multiple Vitamins-Minerals (MULTIVITAMIN WITH MINERALS) tablet Take 1 tablet by mouth daily.    Historical Provider, MD  naproxen sodium (ANAPROX) 220 MG tablet Take 220 mg by mouth 2 (two) times daily as needed (pain).    Historical Provider, MD  SUMAtriptan (IMITREX) 50 MG tablet TAKE 1 TABLET BY MOUTH BI-WEEKLY AS NEEDED FOR HEADACHE. MAY REPEAT ONCE IN 2 HOURS IF NEEDED 12/03/14   Veatrice Bourbon, MD   Meds Ordered and Administered this Visit  Medications - No data to display  BP 157/115 mmHg  Pulse 78  Temp(Src) 98.1 F (36.7 C) (Oral)  Resp 16  Ht 5' 7.5" (1.715 m)  Wt 294 lb (133.358 kg)  BMI 45.34 kg/m2  SpO2 99% No data found.   Physical Exam Nursing notes and Vital Signs reviewed. Appearance:  Patient appears stated age, and in no acute distress.  Patient is obese (BMI   45.3) Eyes:  Pupils are equal, round, and reactive to light and accomodation.  Extraocular movement is intact.  Conjunctivae are not inflamed  Ears:  Canals normal.  Tympanic membranes normal.  Nose:  Mildly congested turbinates.  No sinus tenderness.   Pharynx:  Mildly erythematous Neck:  Supple.  Slightly tender shotty tonsillar nodes, and tender enlarged posterior nodes are palpated bilaterally  Lungs:  Clear to auscultation.  Breath sounds are equal.  Moving air well. Heart:  Regular rate and rhythm without murmurs, rubs, or gallops.  Abdomen:  Nontender without masses or hepatosplenomegaly.  Bowel sounds are present.  No CVA or flank tenderness.  Extremities:  No edema.  No calf tenderness Skin:  No rash present.   ED Course  Procedures  None    Labs Reviewed  STREP A DNA PROBE  POCT RAPID STREP A (OFFICE) negative      MDM   1. Acute pharyngitis, unspecified etiology   2. Viral URI  Note elevated BP today   There is no evidence of bacterial infection today.   Throat culture  pending. Treat symptomatically for now.  Prescription written for Benzonatate [facility]) to take at bedtime for night-time cough.  Take plain guaifenesin (1200mg  extended release tabs such as Mucinex) twice daily, with plenty of water, for cough and congestion. Get adequate rest.   May use Afrin nasal spray (or generic oxymetazoline) twice daily for about 5 days and then discontinue.  Also recommend using saline nasal spray several times daily and saline nasal irrigation (AYR is a common brand).  Try warm salt water gargles for sore throat.  Stop all antihistamines for now, and other non-prescription cough/cold preparations.   Follow-up with family doctor if not improving about10 days.  Monitor blood pressure; if it remains elevated, follow-up with family doctor next week.    Kandra Nicolas, MD 07/07/15 [phone]

## 2015-07-08 LAB — STREP A DNA PROBE: GASP: NEGATIVE

## 2015-07-09 ENCOUNTER — Telehealth: Payer: Self-pay | Admitting: Emergency Medicine

## 2015-09-17 ENCOUNTER — Emergency Department
Admission: EM | Admit: 2015-09-17 | Discharge: 2015-09-17 | Disposition: A | Discharge status: Home / Self Care | Payer: 59 | Source: Home / Self Care | Attending: Family Medicine | Admitting: Family Medicine

## 2015-09-17 ENCOUNTER — Emergency Department: Payer: 59

## 2015-09-17 ENCOUNTER — Encounter: Payer: Self-pay | Admitting: *Deleted

## 2015-09-17 ENCOUNTER — Emergency Department (INDEPENDENT_AMBULATORY_CARE_PROVIDER_SITE_OTHER): Payer: 59

## 2015-09-17 ENCOUNTER — Other Ambulatory Visit: Payer: Self-pay | Admitting: Student

## 2015-09-17 DIAGNOSIS — L03213 Periorbital cellulitis: Secondary | ICD-10-CM | POA: Diagnosis not present

## 2015-09-17 DIAGNOSIS — R0981 Nasal congestion: Secondary | ICD-10-CM

## 2015-09-17 DIAGNOSIS — J328 Other chronic sinusitis: Secondary | ICD-10-CM | POA: Diagnosis not present

## 2015-09-17 LAB — POCT CBC W AUTO DIFF (K'VILLE URGENT CARE)

## 2015-09-17 MED ORDER — CLINDAMYCIN HCL 300 MG PO CAPS: 300.0000 mg | ORAL_CAPSULE | Freq: Three times a day (TID) | ORAL | Status: DC

## 2015-09-17 MED FILL — CLINDAMYCIN HCL 150 MG CAP: 150 | 10 days supply | Qty: 60 | Fill #0

## 2015-09-17 MED FILL — METFORMIN HCL ER 500 MG TAB: 500 | 30 days supply | Qty: 30 | Fill #2

## 2015-09-17 NOTE — ED Provider Notes (Signed)
CSN: [id number]     Arrival date & time 09/17/15  0818 History   First MD Initiated Contact with Patient 09/17/15 [phone]     Chief Complaint  Patient presents with  . Eye Problem     HPI Comments: Patient states that she developed a small stye on her left lower medial eyelid about two weeks ago that resolved.  She has a history of seasonal rhinitis, and one week ago she developed increased sinus congestion.  She then gradually developed increasing pressure in her left face, and during the past two days she has developed increasing pain/swelling around her left eye and mild pain with left eye movement.  She has had chills during the past two days.  She denies changes in vision.  The history is provided by the patient.    Past Medical History  Diagnosis Date  . Hypertension   . Uterine bleeding, dysfunctional   . Migraine   . Anemia   . Diabetes mellitus without complication ([place])   . Allergy    Past Surgical History  Procedure Laterality Date  . Endometrial ablation  March 2012    For DUB, now resolved  . Cesarean section      X3   Family History  Problem Relation Age of Onset  . Hypothyroidism Mother   . Heart disease Mother 39    MI  . Diabetes Mother   . Hypertension Mother   . Hypothyroidism Sister   . Heart disease Father   . Diabetes Father   . Hypertension Father   . Hyperlipidemia Brother    Social History  Substance Use Topics  . Smoking status: Former Smoker    Quit date: 11/29/2010  . Smokeless tobacco: Never Used  . Alcohol Use: No   OB History    Gravida Para Term Preterm AB TAB SAB Ectopic Multiple Living   3 3 3       3      Review of Systems No sore throat No cough No pleuritic pain No wheezing + nasal congestion + post-nasal drainage No sinus pain/pressure + redness and drainage left eye No earache No hemoptysis No SOB No fever, + chills No nausea No vomiting No abdominal pain No diarrhea No urinary symptoms No skin rash + fatigue No  myalgias + headache Used OTC meds without relief  Allergies  Betadine  Home Medications   Prior to Admission medications   Medication Sig Start Date End Date Taking? Authorizing Provider  clindamycin (CLEOCIN) 300 MG capsule Take 1 capsule (300 mg total) by mouth 3 (three) times daily. (take every 8 hours) 09/17/15   Kandra Nicolas, MD  metFORMIN (GLUCOPHAGE-XR) 500 MG 24 hr tablet TAKE 1 TABLET BY MOUTH DAILY WITH BREAKFAST 07/05/15   Veatrice Bourbon, MD  Multiple Vitamins-Minerals (MULTIVITAMIN WITH MINERALS) tablet Take 1 tablet by mouth daily.    Historical Provider, MD  naproxen sodium (ANAPROX) 220 MG tablet Take 220 mg by mouth 2 (two) times daily as needed (pain).    Historical Provider, MD  SUMAtriptan (IMITREX) 50 MG tablet TAKE 1 TABLET BY MOUTH BI-WEEKLY AS NEEDED FOR HEADACHE. MAY REPEAT ONCE IN 2 HOURS IF NEEDED 12/03/14   Veatrice Bourbon, MD   Meds Ordered and Administered this Visit  Medications - No data to display  BP 142/99 mmHg  Pulse 80  Temp(Src) 98.2 F (36.8 C) (Oral)  Resp 16  Wt 289 lb (131.09 kg)  SpO2 100% No data found.   Physical Exam  Constitutional: She is oriented to person, place, and time. She appears well-developed and well-nourished. No distress.  HENT:  Head: Normocephalic.    Right Ear: Tympanic membrane, external ear and ear canal normal.  Left Ear: Tympanic membrane, external ear and ear canal normal.  Mouth/Throat: Oropharynx is clear and moist.  Eyes: EOM are normal. Pupils are equal, round, and reactive to light. Right eye exhibits no chemosis, no discharge, no exudate and no hordeolum. Left eye exhibits discharge and hordeolum. Left eye exhibits no chemosis and no exudate. Right conjunctiva is not injected. Right conjunctiva has no hemorrhage. Left conjunctiva is injected. Left conjunctiva has no hemorrhage.    Left upper and lower eyelids mildly swollen and tender to palpation.  Tenderness extends to the left maxillary sinus area.   There is a shotty tender left pre-auricular node present.  Neck: Neck supple.  Cardiovascular: Normal heart sounds.   Pulmonary/Chest: Breath sounds normal.  Lymphadenopathy:    She has no cervical adenopathy.  Neurological: She is alert and oriented to person, place, and time.  Skin: Skin is warm and dry. No rash noted.  Nursing note and vitals reviewed.   ED Course  Procedures  None    Labs Reviewed  POCT CBC W AUTO DIFF ([facility]):  WBC 6.1; LY 35.0; MO 2.4; GR 62.6; Hgb 13.5; Platelets 267     Imaging Review Ct Maxillofacial Wo Cm  09/17/2015  CLINICAL DATA:  Sinus congestion for 1 week. Left facial and orbital pain. EXAM: CT MAXILLOFACIAL WITHOUT CONTRAST TECHNIQUE: Multidetector CT imaging of the maxillofacial structures was performed. Multiplanar CT image reconstructions were also generated. A small metallic BB was placed on the right temple in order to reliably differentiate right from left. COMPARISON:  None. FINDINGS: Facial bones appear intact. Specifically, the mandible, maxilla, pterygoid plates, nasal septum, nasal bones, zygomas, skullbase, and orbits appear intact. No facial bony trauma or asymmetry. Sinuses and visualized mastoids remain clear. No significant sinus mucosal thickening, opacification or sinus air-fluid level. Dental hardware creates artifact. Minor left facial maxillary and orbital soft tissue swelling. Orbits are symmetric. No globe abnormality. No proptosis. Visualized intracranial contents demonstrate no acute process. IMPRESSION: No acute facial bony trauma or acute osseous finding. Clear paranasal sinuses.  No significant sinus disease or sinusitis. Left facial and orbital mild soft tissue asymmetry/ swelling. Electronically Signed   By: Jerilynn Mages.  Shick M.D.   On: 09/17/2015 10:02     Visual Acuity Review  Right Eye Distance: 20/20 Left Eye Distance: 20/20 Bilateral Distance: 20/20    MDM   1. Preseptal cellulitis of left eye; no evidence  orbital cellulitis.  Note normal WBC    Begin Clindamycin 300mg  Q8 hours #30 Apply warm compress to left eye for about 10 minutes, every 2 to 3 hours until improved.  May take Tylenol as needed for pain.  Followup with ophthalmologist if not improved 3 days. If symptoms become significantly worse during the night or over the weekend, proceed to the local emergency room    Kandra Nicolas, MD 09/17/15 1226

## 2015-09-17 NOTE — ED Notes (Signed)
Pt c/o stye in left eye 2 weeks ago that she feels resolved after a week but then developed left eye redness, swelling and drainage along with sinus pressure and nasal congestion for 1 week. Taken Severe sinus OTC.

## 2015-09-17 NOTE — Discharge Instructions (Signed)
Apply warm compress to left eye for about 10 minutes, every 2 to 3 hours until improved.  May take Tylenol as needed for pain.  If symptoms become significantly worse during the night or over the weekend, proceed to the local emergency room.

## 2015-11-03 MED FILL — METFORMIN HCL ER 500 MG TAB: 500 | 30 days supply | Qty: 30 | Fill #3

## 2015-11-09 MED FILL — SUMATRIPTAN SUCC 50 MG TAB: 50 | 30 days supply | Qty: 9 | Fill #0

## 2015-12-10 ENCOUNTER — Other Ambulatory Visit: Payer: Self-pay | Admitting: Student

## 2015-12-10 MED FILL — PREVIDENT 5000 BOOSTER PLUS: 1.1 | 30 days supply | Qty: 100 | Fill #0

## 2015-12-10 MED FILL — METFORMIN HCL ER 500 MG TAB: 500 | 30 days supply | Qty: 30 | Fill #0

## 2015-12-10 NOTE — Telephone Encounter (Signed)
appt 01/07/16 Tara Campos, Tara Campos, Tara Campos

## 2015-12-10 NOTE — Telephone Encounter (Signed)
Refill x1.  Needs appt.  Has not been seen for DM2 in over 2 years.

## 2015-12-17 MED FILL — SUMATRIPTAN SUCC 50 MG TAB: 50 | 30 days supply | Qty: 9 | Fill #1

## 2016-01-07 ENCOUNTER — Ambulatory Visit (INDEPENDENT_AMBULATORY_CARE_PROVIDER_SITE_OTHER): Payer: 59 | Admitting: Student

## 2016-01-07 ENCOUNTER — Encounter: Payer: Self-pay | Admitting: Student

## 2016-01-07 VITALS — BP 134/90 | HR 87 | Temp 98.4°F | Ht 68.0 in | Wt 291.0 lb

## 2016-01-07 DIAGNOSIS — Z0000 Encounter for general adult medical examination without abnormal findings: Secondary | ICD-10-CM | POA: Insufficient documentation

## 2016-01-07 DIAGNOSIS — Z23 Encounter for immunization: Secondary | ICD-10-CM | POA: Diagnosis not present

## 2016-01-07 DIAGNOSIS — I10 Essential (primary) hypertension: Secondary | ICD-10-CM

## 2016-01-07 DIAGNOSIS — E119 Type 2 diabetes mellitus without complications: Secondary | ICD-10-CM

## 2016-01-07 LAB — POCT URINALYSIS DIPSTICK
BILIRUBIN UA: NEGATIVE
Glucose, UA: 500
Ketones, UA: NEGATIVE
Leukocytes, UA: NEGATIVE
NITRITE UA: NEGATIVE
PH UA: 5.5
Spec Grav, UA: 1.02
UROBILINOGEN UA: 0.2

## 2016-01-07 LAB — BASIC METABOLIC PANEL
BUN: 10 mg/dL (ref 7–25)
CALCIUM: 9.2 mg/dL (ref 8.6–10.4)
CO2: 24 mmol/L (ref 20–31)
Chloride: 101 mmol/L (ref 98–110)
Creat: 0.75 mg/dL (ref 0.50–1.05)
Glucose, Bld: 341 mg/dL — ABNORMAL HIGH (ref 65–99)
POTASSIUM: 4.2 mmol/L (ref 3.5–5.3)
Sodium: 136 mmol/L (ref 135–146)

## 2016-01-07 LAB — POCT UA - MICROSCOPIC ONLY

## 2016-01-07 LAB — LIPID PANEL
CHOLESTEROL: 191 mg/dL (ref 125–200)
HDL: 87 mg/dL (ref >=46)
LDL CALC: 89 mg/dL (ref <130)
TRIGLYCERIDES: 76 mg/dL (ref <150)
Total CHOL/HDL Ratio: 2.2 Ratio (ref <=5.0)
VLDL: 15 mg/dL (ref <30)

## 2016-01-07 LAB — POCT GLYCOSYLATED HEMOGLOBIN (HGB A1C): Hemoglobin A1C: 12.3

## 2016-01-07 MED ORDER — DULAGLUTIDE 0.75 MG/0.5ML ~~LOC~~ SOAJ: 0.7500 mg | SUBCUTANEOUS | Status: DC

## 2016-01-07 MED ORDER — EXENATIDE 5 MCG/0.02ML ~~LOC~~ SOPN: 5.0000 ug | PEN_INJECTOR | Freq: Two times a day (BID) | SUBCUTANEOUS | Status: DC

## 2016-01-07 MED ORDER — LISINOPRIL 10 MG PO TABS: 10.0000 mg | ORAL_TABLET | Freq: Every day | ORAL | Status: DC

## 2016-01-07 MED ORDER — METFORMIN HCL 500 MG PO TABS: 500.0000 mg | ORAL_TABLET | Freq: Two times a day (BID) | ORAL | Status: DC

## 2016-01-07 MED FILL — LISINOPRIL 10 MG TABLET: 10 | 90 days supply | Qty: 90 | Fill #0

## 2016-01-07 MED FILL — TRULICITY 0.75 MG/0.5 ML PE: 0.75 | 28 days supply | Qty: 2 | Fill #0

## 2016-01-07 MED FILL — metFORMIN HCL 500 MG TABS: 500 | 90 days supply | Qty: 180 | Fill #0

## 2016-01-07 NOTE — Assessment & Plan Note (Addendum)
A1c 12.3 today. After a long discussion about treatment and that she would likely benefit from initiation of insulin she ultimately decided to put off starting insulin. She would like to increase her metformin and start additional agent today. She will be started on Trulicity and increase her metformin from 500 daily to 500 twice a day. She was seen by pharmacy for medication regimen education. - Discussed the importance of diet modification and exercise. Offered diabetic education classes but she has had this already and feels she dos not need it again - Will check her blood sugar daily -  will return in one week for blood sugar check -Urine microalbumin today - Encouraged to follow with eye doctor for diabetic eye exam - Return in 3 months for A1c - If A1c is not improve at next check will initiate insulin

## 2016-01-07 NOTE — Assessment & Plan Note (Signed)
157/108 then on repeat 134/90. She is not currently on any antihypertensive. Given her poorly controlled diabetes will initiate lisinopril 20 daily - Return in one week for blood pressure check - BMP today

## 2016-01-07 NOTE — Progress Notes (Signed)
   Subjective:    Patient ID: Tara Campos, female    DOB: [birth year], 50 y.o.   MRN: [id number]   CC: Dm check, physical exam   HPI: 50 y/o F presenting for physical exam and diabetes check  Diabetes - A1c 12.3 - denies numbness or tingling of her hand and feet - reports that she does check her blood sugars and they typically range from 200s to 400s - last eye exam 10/2014 - wears protective foot wear - reports increased thirst and urinary frequency- denies dysuria - reports eating breads and pastas, denies drinking regular sodas but does drink a lot of diet soda  HTN - Blood pressure elevated in the office to 157/108 then on repeat 134/90 - denies chest pain , SOB, headache - Not currently on any antihypertensive  Social - Works as a Development worker, international aid at [employer] and she feels her job is stressful which has kept her from coming for regular visit - she also feels this stress keeps her from eating healfully  Smoking status reviewed  Review of Systems Denies recent illness, else per HPI Objective:  BP 134/90 mmHg  Pulse 87  Temp(Src) 98.4 F (36.9 C) (Oral)  Ht 5\' 8"  (1.727 m)  Wt 291 lb (131.997 kg)  BMI 44.26 kg/m2  LMP  Vitals and nursing note reviewed  General: NAD Cardiac: RRR Respiratory: CTAB, normal effort Extremities: no edema or cyanosis. WWP. See quality metrics for diabetic foot exam Skin: warm and dry, no rashes noted Neuro: alert and oriented, no focal deficits   Assessment & Plan:    Diabetes mellitus without complication [id number] today. After a long discussion about treatment and that she would likely benefit from initiation of insulin she ultimately decided to put off starting insulin. She would like to increase her metformin and start additional agent today. She will be started on Trulicity and increase her metformin from 500 daily to 500 twice a day. She was seen by pharmacy for medication regimen education. - Discussed the  importance of diet modification and exercise. Offered diabetic education classes but she has had this already and feels she dos not need it again - Will check her blood sugar daily -  will return in one week for blood sugar check -Urine microalbumin today - Encouraged to follow with eye doctor for diabetic eye exam - Return in 3 months for A1c - If A1c is not improve at next check will initiate insulin  HYPERTENSION, BENIGN SYSTEMIC 157/108 then on repeat 134/90. She is not currently on any antihypertensive. Given her poorly controlled diabetes will initiate lisinopril 20 daily - Return in one week for blood pressure check - BMP today   Health care maintenance Due for colonoscopy, mammogram, lipid panel - Colonoscopy and mammogram information given. Mammogram ordered     Alissandra Geoffroy A. Lincoln Brigham MD, [place] [facility] Resident PGY-2 Pager [phone]

## 2016-01-07 NOTE — Patient Instructions (Addendum)
Follow up in 1 week for Blood pressure and Blood glucose check Your A1c was 12.3- this is very high Check your Blood sugar daily Your metformin will be increased to 500 twice a day You were also started on Trulicity  You were started on Lisinopril for Blood pressure If you have any questions or concerns,c all the office at [phone]

## 2016-01-07 NOTE — Assessment & Plan Note (Signed)
Due for colonoscopy, mammogram, lipid panel - Colonoscopy and mammogram information given. Mammogram ordered

## 2016-01-08 LAB — MICROALBUMIN / CREATININE URINE RATIO
CREATININE, URINE: 81 mg/dL (ref 20–320)
MICROALB UR: 86.5 mg/dL
MICROALB/CREAT RATIO: 1068 ug/mg{creat} — AB (ref <30)

## 2016-01-17 ENCOUNTER — Encounter: Payer: Self-pay | Admitting: Student

## 2016-01-17 ENCOUNTER — Ambulatory Visit (INDEPENDENT_AMBULATORY_CARE_PROVIDER_SITE_OTHER): Payer: 59 | Admitting: Student

## 2016-01-17 VITALS — BP 150/95 | HR 86 | Temp 98.5°F | Wt 287.0 lb

## 2016-01-17 DIAGNOSIS — E1110 Type 2 diabetes mellitus with ketoacidosis without coma: Secondary | ICD-10-CM

## 2016-01-17 DIAGNOSIS — I10 Essential (primary) hypertension: Secondary | ICD-10-CM

## 2016-01-17 DIAGNOSIS — E1310 Other specified diabetes mellitus with ketoacidosis without coma: Secondary | ICD-10-CM | POA: Diagnosis not present

## 2016-01-17 DIAGNOSIS — E119 Type 2 diabetes mellitus without complications: Secondary | ICD-10-CM

## 2016-01-17 LAB — GLUCOSE, CAPILLARY: GLUCOSE-CAPILLARY: 184 mg/dL — AB (ref 65–99)

## 2016-01-17 MED ORDER — ASPIRIN EC 81 MG PO TBEC: 81.0000 mg | DELAYED_RELEASE_TABLET | Freq: Every day | ORAL | Status: DC

## 2016-01-17 MED ORDER — GLUCOSE BLOOD VI STRP: ORAL_STRIP | Status: DC

## 2016-01-17 MED FILL — ASPIR-LOW EC 81 MG TABLET: 81 | 90 days supply | Qty: 90 | Fill #0

## 2016-01-17 NOTE — Progress Notes (Signed)
   Subjective:    Patient ID: Tara Campos, female    DOB: April 13, 1966, 50 y.o.   MRN: [id number]   CC: follow up blood glucose and blood pressure  HPI: 50 y/o with uncontrolled DM, HTN presents for DM and HTn check  DM - POCT glucose today 184- ate a sandwich prior to coming in - started on Trulicity at last visit with increase in metoprolol to 500 BID from 500 qD - reports occasional nausea which she attributed to metformin as she has had this with dose increases in the past - denies diarrhea - blood sugars have been in the low to high 100s - denies hypoglycemic epidodes  HTN - started in lisinopril at last visit - BP 143/113 initially, repeat 150/95 - felt light headed last week, on BP at work her pressure was 115/70, she did not take her lisinopril that day as a result - otherwise she has been taking her lisinopril as prescribed  Migraine - Started yesterday - has been taking home imitrex which has been helping - occasionally does have flashes of light with her typical migraines - otherwise denies changes in vision, chest pain, SOB  Smoking status reviewed  Review of Systems Per HPI else denies recent illness, reports improved polydipsia and urinary frequency   Objective:  BP 150/95 mmHg  Pulse 86  Temp(Src) 98.5 F (36.9 C) (Oral)  Wt 287 lb (130.182 kg)  SpO2 99% Vitals and nursing note reviewed  General: NAD Cardiac: RRR,  Respiratory: CTAB, normal effort Extremities: no edema or cyanosis. WWP. Skin: warm and dry, no rashes noted Neuro: alert and oriented   Assessment & Plan:    Diabetes mellitus without complication Although reports improved blood sugars, clinic sugar was [id number] - continue trulicity and metformin - will ask pateint to record blood sugars and bring to next visit - return in 2 weeks - consider increase in trulicity at that time if BGs still elevated - A1c in three months   HYPERTENSION, BENIGN SYSTEMIC BP elevated in clinic with  evidence of symptomatic hypotension outpateint. Elevated BPs likely in the setting of pain from migraine - Will continue current dose and monitor - follow up for BP check in 1 week     Tara Campos A. Tara Brigham MD, [place] [facility] Resident PGY-2 Pager [phone]

## 2016-01-17 NOTE — Patient Instructions (Signed)
Follow up on 6/9 for nurse BP visit. Then in 3 months for A1c If you feel your blood pressure is too low, please check it and hold your lisinopril if it is less than 120/70 If your blood glucose is less than 80, please eat candy or juice If you have any questions or concerns, call the office at [phone]

## 2016-01-18 ENCOUNTER — Other Ambulatory Visit: Payer: Self-pay | Admitting: Student

## 2016-01-18 MED FILL — SUMATRIPTAN SUCC 50 MG TAB: 50 | 30 days supply | Qty: 9 | Fill #0

## 2016-01-18 NOTE — Assessment & Plan Note (Signed)
Although reports improved blood sugars, clinic sugar was [id number] - continue trulicity and metformin - will ask pateint to record blood sugars and bring to next visit - return in 2 weeks - consider increase in trulicity at that time if BGs still elevated - A1c in three months

## 2016-01-18 NOTE — Assessment & Plan Note (Signed)
BP elevated in clinic with evidence of symptomatic hypotension outpateint. Elevated BPs likely in the setting of pain from migraine - Will continue current dose and monitor - follow up for BP check in 1 week

## 2016-01-26 ENCOUNTER — Telehealth: Payer: Self-pay | Admitting: Student

## 2016-01-26 DIAGNOSIS — E119 Type 2 diabetes mellitus without complications: Secondary | ICD-10-CM

## 2016-01-26 MED ORDER — GLUCOSE BLOOD VI STRP: ORAL_STRIP | Status: DC

## 2016-01-26 NOTE — Telephone Encounter (Signed)
Testing trip Rx filled

## 2016-01-28 IMAGING — US US ABDOMEN COMPLETE
1 series · 14 of 25 positions shown · non-contrast
Comparison: US ABDOMEN COMPLETE dated 11/30/2008

CLINICAL DATA: Abdominal pain and bruising.

EXAM:
ULTRASOUND ABDOMEN COMPLETE

[Series 1: us abdomen complete · 0.27mm/px · 14 of 87 slices shown]
[im 1/87]
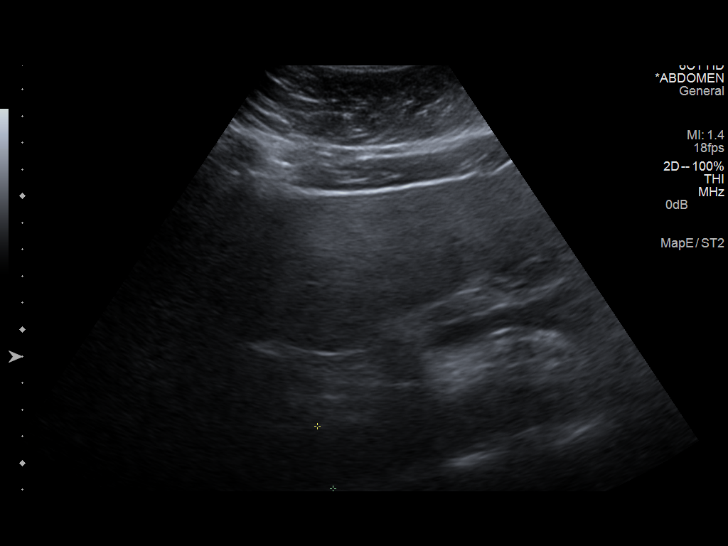
[im 8/87]
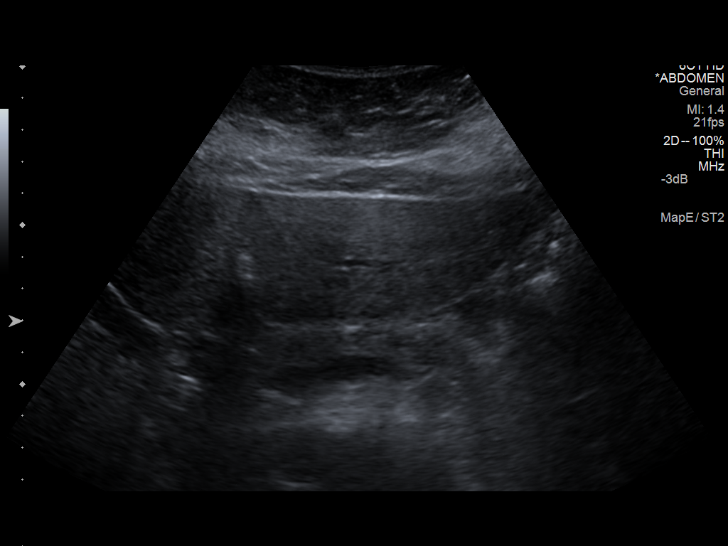
[im 15/87]
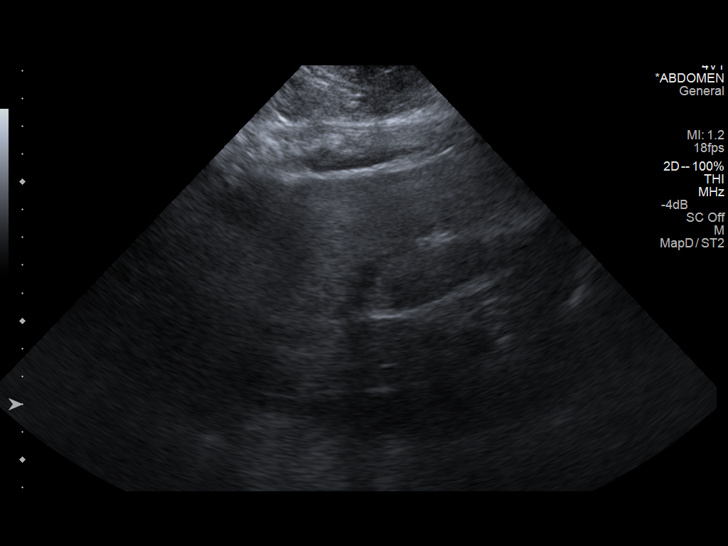
[im 22/87]
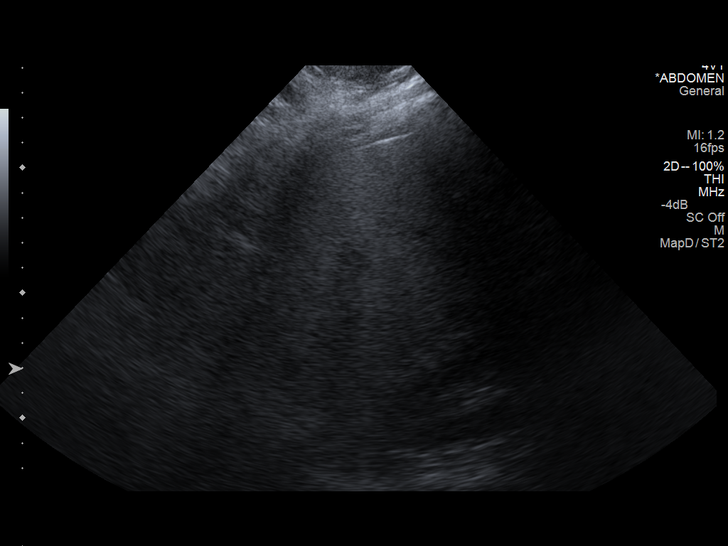
[im 29/87]
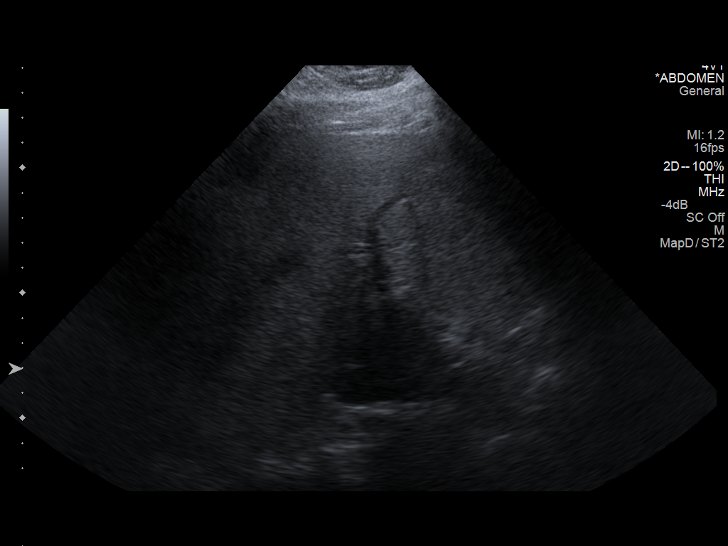
[im 33/87]
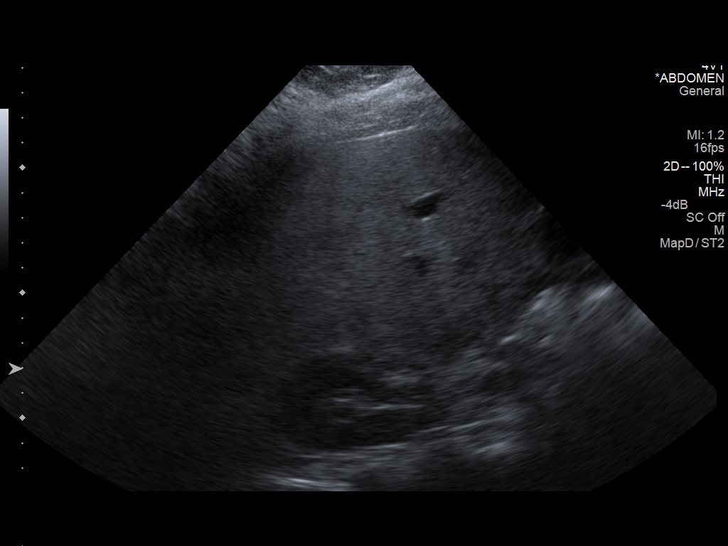
[im 40/87]
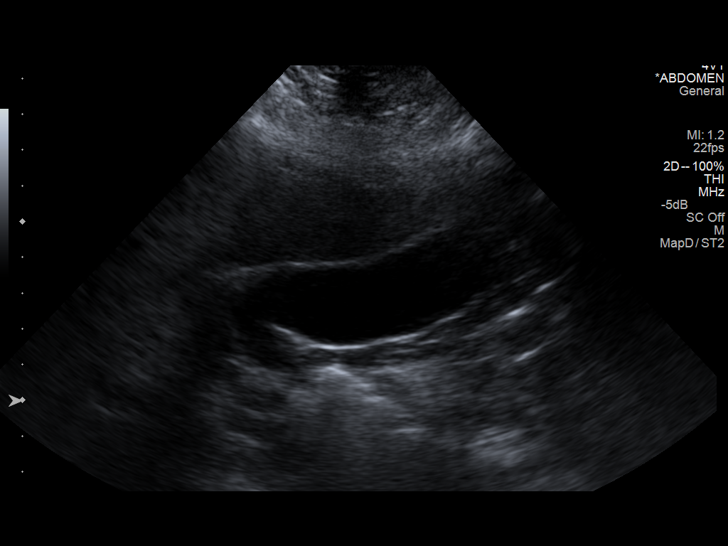
[im 47/87]
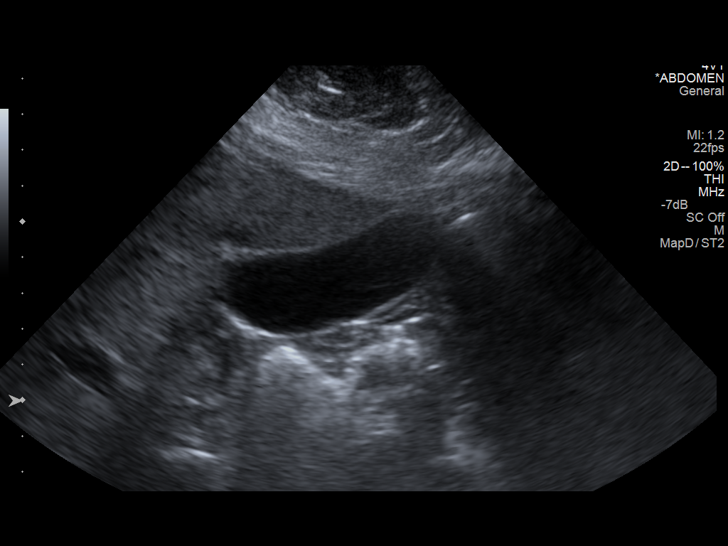
[im 54/87]
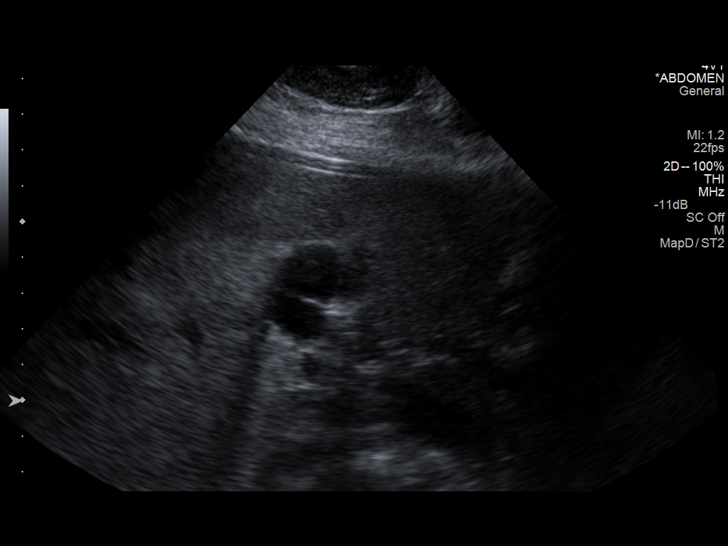
[im 58/87]
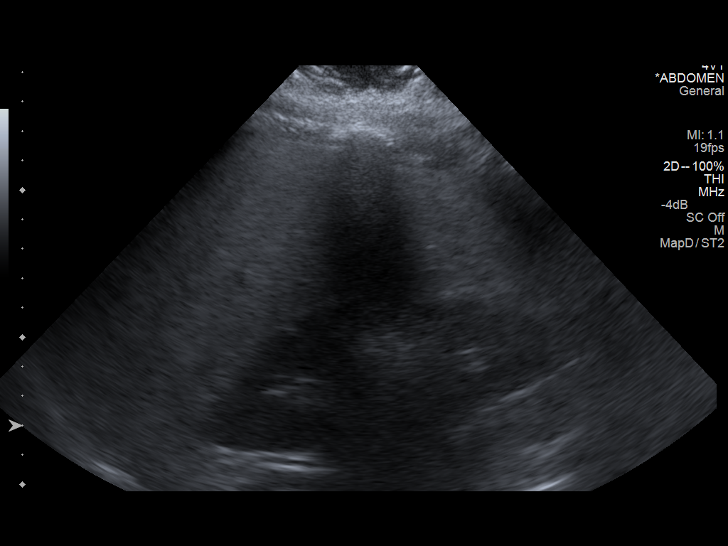
[im 65/87]
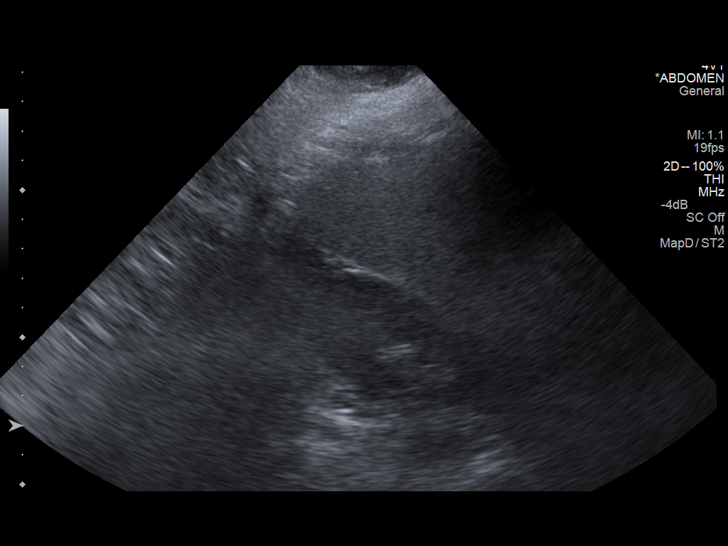
[im 72/87]
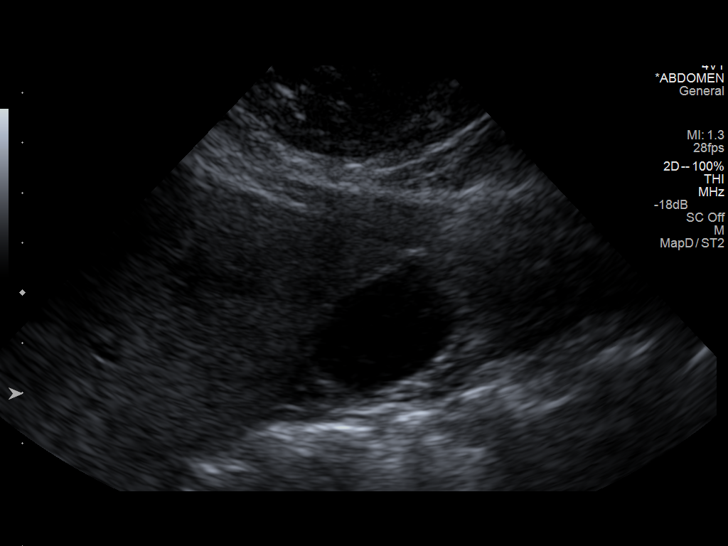
[im 79/87]
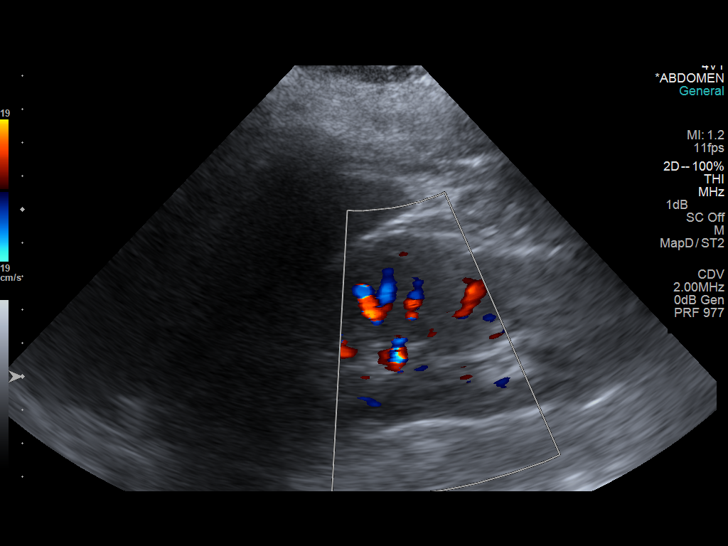
[im 87/87]
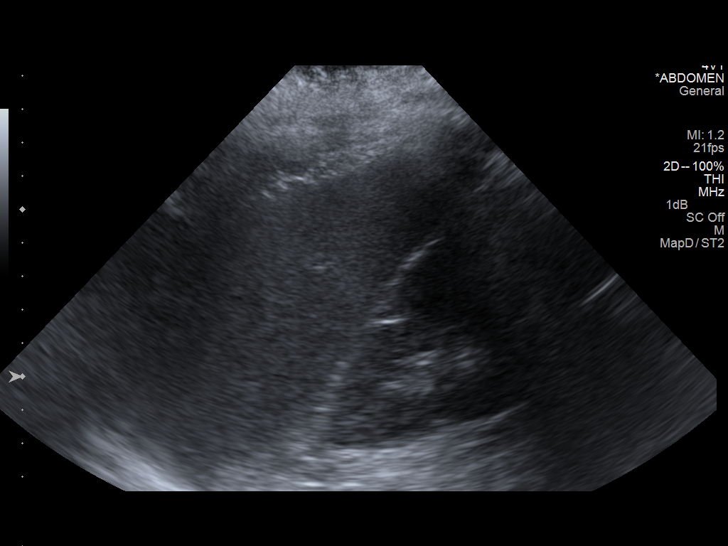

[14 of 25 positions shown; findings below may reference images not displayed]

FINDINGS: Gallbladder:

No gallstones or wall thickening visualized. No sonographic Murphy
sign noted.

Common bile duct:

Diameter: 4 mm, within normal limits.

Liver:

Diffusely increased in echogenicity.  No definite focal lesion.

IVC:

No abnormality visualized.

Pancreas:

Visualized portion unremarkable.

Spleen:

7.7 cm, negative.

Right Kidney:

Length: 12.4 cm. Parenchymal echogenicity is within normal limits.
No hydronephrosis. No focal lesion.

Left Kidney:

Length: 12.0 cm. Parenchymal echogenicity is within normal limits.
No hydronephrosis. No focal lesion.

Abdominal aorta:

No aneurysm visualized.

Other findings:

No free fluid.
IMPRESSION: 1. No acute findings.
2. Hepatic steatosis.

## 2016-02-03 ENCOUNTER — Other Ambulatory Visit: Payer: Self-pay | Admitting: Student

## 2016-02-04 MED FILL — TRUE METRIX GLUCOSE TEST ST: 50 days supply | Qty: 100 | Fill #0

## 2016-02-04 MED FILL — TRULICITY 0.75 MG/0.5 ML PE: 0.75 | 28 days supply | Qty: 2 | Fill #0

## 2016-02-14 ENCOUNTER — Other Ambulatory Visit: Payer: Self-pay | Admitting: Student

## 2016-02-14 DIAGNOSIS — Z1231 Encounter for screening mammogram for malignant neoplasm of breast: Secondary | ICD-10-CM

## 2016-02-23 ENCOUNTER — Ambulatory Visit
Admission: RE | Admit: 2016-02-23 | Discharge: 2016-02-23 | Disposition: A | Discharge status: Home / Self Care | Payer: 59 | Source: Ambulatory Visit | Attending: Family Medicine | Admitting: Family Medicine

## 2016-02-23 DIAGNOSIS — Z1231 Encounter for screening mammogram for malignant neoplasm of breast: Secondary | ICD-10-CM | POA: Diagnosis not present

## 2016-03-16 MED FILL — TRULICITY 0.75 MG/0.5 ML PE: 0.75 | 28 days supply | Qty: 2 | Fill #1

## 2016-03-27 ENCOUNTER — Encounter: Payer: Self-pay | Admitting: Gastroenterology

## 2016-04-19 ENCOUNTER — Ambulatory Visit (AMBULATORY_SURGERY_CENTER): Payer: Self-pay

## 2016-04-19 VITALS — Ht 67.5 in | Wt 302.0 lb

## 2016-04-19 DIAGNOSIS — Z1211 Encounter for screening for malignant neoplasm of colon: Secondary | ICD-10-CM

## 2016-04-19 MED ORDER — SUPREP BOWEL PREP KIT 17.5-3.13-1.6 GM/177ML PO SOLN: 1.0000 | Freq: Once | ORAL | 0 refills | Status: AC

## 2016-04-19 MED FILL — SUPREP BOWEL PREP KIT: 17.5-3.13-1 | 2 days supply | Qty: 354 | Fill #0

## 2016-04-19 NOTE — Progress Notes (Signed)
No allergies to eggs or soy No past problems with anesthesia No diet meds No home oxygen  Has email and internet; declined emmi 

## 2016-04-21 MED FILL — SUMATRIPTAN SUCC 50 MG TAB: 50 | 30 days supply | Qty: 9 | Fill #1

## 2016-04-21 MED FILL — LISINOPRIL 10 MG TABLET: 10 | 90 days supply | Qty: 90 | Fill #1

## 2016-04-21 MED FILL — TRULICITY 0.75 MG/0.5 ML PE: 0.75 | 28 days supply | Qty: 2 | Fill #2

## 2016-05-03 ENCOUNTER — Encounter: Payer: Self-pay | Admitting: Gastroenterology

## 2016-05-03 ENCOUNTER — Ambulatory Visit (AMBULATORY_SURGERY_CENTER): Payer: 59 | Admitting: Gastroenterology

## 2016-05-03 VITALS — BP 142/75 | HR 77 | Temp 97.1°F | Resp 10 | Ht 67.0 in | Wt 302.0 lb

## 2016-05-03 DIAGNOSIS — K633 Ulcer of intestine: Secondary | ICD-10-CM

## 2016-05-03 DIAGNOSIS — Z1211 Encounter for screening for malignant neoplasm of colon: Secondary | ICD-10-CM

## 2016-05-03 DIAGNOSIS — I10 Essential (primary) hypertension: Secondary | ICD-10-CM | POA: Diagnosis not present

## 2016-05-03 DIAGNOSIS — K529 Noninfective gastroenteritis and colitis, unspecified: Secondary | ICD-10-CM | POA: Diagnosis not present

## 2016-05-03 DIAGNOSIS — E119 Type 2 diabetes mellitus without complications: Secondary | ICD-10-CM | POA: Diagnosis not present

## 2016-05-03 LAB — GLUCOSE, CAPILLARY
GLUCOSE-CAPILLARY: 131 mg/dL — AB (ref 65–99)
GLUCOSE-CAPILLARY: 113 mg/dL — AB (ref 65–99)

## 2016-05-03 MED ORDER — SODIUM CHLORIDE 0.9 % IV SOLN: 500.0000 mL | INTRAVENOUS | Status: DC

## 2016-05-03 NOTE — Patient Instructions (Signed)
YOU HAD AN ENDOSCOPIC PROCEDURE TODAY AT THE [facility]:   Refer to the procedure report that was given to you for any specific questions about what was found during the examination.  If the procedure report does not answer your questions, please call your gastroenterologist to clarify.  If you requested that your care partner not be given the details of your procedure findings, then the procedure report has been included in a sealed envelope for you to review at your convenience later.  YOU SHOULD EXPECT: Some feelings of bloating in the abdomen. Passage of more gas than usual.  Walking can help get rid of the air that was put into your GI tract during the procedure and reduce the bloating. If you had a lower endoscopy (such as a colonoscopy or flexible sigmoidoscopy) you may notice spotting of blood in your stool or on the toilet paper. If you underwent a bowel prep for your procedure, you may not have a normal bowel movement for a few days.  Please Note:  You might notice some irritation and congestion in your nose or some drainage.  This is from the oxygen used during your procedure.  There is no need for concern and it should clear up in a day or so.  SYMPTOMS TO REPORT IMMEDIATELY:   Following lower endoscopy (colonoscopy or flexible sigmoidoscopy):  Excessive amounts of blood in the stool  Significant tenderness or worsening of abdominal pains  Swelling of the abdomen that is new, acute  Fever of 100F or higher    For urgent or emergent issues, a gastroenterologist can be reached at any hour by calling [phone].   DIET:  We do recommend a small meal at first, but then you may proceed to your regular diet.  Drink plenty of fluids but you should avoid alcoholic beverages for 24 hours.  ACTIVITY:  You should plan to take it easy for the rest of today and you should NOT DRIVE or use heavy machinery until tomorrow (because of the sedation medicines used during the test).     FOLLOW UP: Our staff will call the number listed on your records the next business day following your procedure to check on you and address any questions or concerns that you may have regarding the information given to you following your procedure. If we do not reach you, we will leave a message.  However, if you are feeling well and you are not experiencing any problems, there is no need to return our call.  We will assume that you have returned to your regular daily activities without incident.  If any biopsies were taken you will be contacted by phone or by letter within the next 1-3 weeks.  Please call us at [phone] if you have not heard about the biopsies in 3 weeks.    SIGNATURES/CONFIDENTIALITY: You and/or your care partner have signed paperwork which will be entered into your electronic medical record.  These signatures attest to the fact that that the information above on your After Visit Summary has been reviewed and is understood.  Full responsibility of the confidentiality of this discharge information lies with you and/or your care-partner.   Resume medications. Information given on hemorrhoids. 

## 2016-05-03 NOTE — Progress Notes (Signed)
Called to room to assist during endoscopic procedure.  Patient ID and intended procedure confirmed with present staff. Received instructions for my participation in the procedure from the performing physician.  

## 2016-05-03 NOTE — Op Note (Signed)
[facility] Patient Name: Tara Campos Procedure Date: 05/03/2016 10:41 AM MRN: [id number] Endoscopist: Remo Lipps P. Havery Moros , MD Age: 50 Referring MD:  Date of Birth: May 06, 1966 Gender: Female Account #: [id number] Procedure:                Colonoscopy Indications:              Screening for malignant neoplasm in the colon, This                            is the patient's first colonoscopy Medicines:                Monitored Anesthesia Care Procedure:                Pre-Anesthesia Assessment:                           - Prior to the procedure, a History and Physical                            was performed, and patient medications and                            allergies were reviewed. The patient's tolerance of                            previous anesthesia was also reviewed. The risks                            and benefits of the procedure and the sedation                            options and risks were discussed with the patient.                            All questions were answered, and informed consent                            was obtained. Prior Anticoagulants: The patient has                            taken no previous anticoagulant or antiplatelet                            agents. ASA Grade Assessment: III - A patient with                            severe systemic disease. After reviewing the risks                            and benefits, the patient was deemed in                            satisfactory condition to undergo the procedure.  After obtaining informed consent, the colonoscope                            was passed under direct vision. Throughout the                            procedure, the patient's blood pressure, pulse, and                            oxygen saturations were monitored continuously. The                            Model CF-H180AL [id number]) scope was introduced                            through the  anus and advanced to the the cecum,                            identified by appendiceal orifice and ileocecal                            valve. The colonoscopy was performed without                            difficulty. The patient tolerated the procedure                            well. The quality of the bowel preparation was                            good. The terminal ileum, ileocecal valve,                            appendiceal orifice, and rectum were photographed. Scope In: 10:46:49 AM Scope Out: [id number] AM Scope Withdrawal Time: 0 hours 17 minutes 40 seconds  Total Procedure Duration: 0 hours 20 minutes 29 seconds  Findings:                 The perianal and digital rectal examinations were                            normal.                           A patchy area of a few erosions was found in the                            cecum. Biopsies were taken with a cold forceps for                            histology.                           The terminal ileum appeared normal.  Non-bleeding internal hemorrhoids were found during                            retroflexion.                           The exam was otherwise without abnormality. No                            other inflammatory changes or polyps appreciated. Complications:            No immediate complications. Estimated blood loss:                            Minimal. Estimated Blood Loss:     Estimated blood loss was minimal. Impression:               - Some scattered erosions in the cecum. Biopsied.                           - The examined portion of the ileum was normal.                           - Non-bleeding internal hemorrhoids.                           - The examination was otherwise normal. Recommendation:           - Patient has a contact number available for                            emergencies. The signs and symptoms of potential                            delayed complications  were discussed with the                            patient. Return to normal activities tomorrow.                            Written discharge instructions were provided to the                            patient.                           - Resume previous diet.                           - Continue present medications.                           - Await pathology results.                           - Repeat colonoscopy in 10 years for screening  purposes. Remo Lipps P. Armbruster, MD 05/03/2016 11:11:56 AM This report has been signed electronically.

## 2016-05-03 NOTE — Progress Notes (Signed)
Report to PACU, RN, vss, BBS= Clear.  

## 2016-05-04 ENCOUNTER — Telehealth: Payer: Self-pay

## 2016-05-04 NOTE — Telephone Encounter (Signed)
  Follow up Call-  Call back number 05/03/2016  Post procedure Call Back phone  # [phone]  Permission to leave phone message Yes  Some recent data might be hidden     Patient questions:  Do you have a fever, pain , or abdominal swelling? No. Pain Score  0 *  Have you tolerated food without any problems? Yes.    Have you been able to return to your normal activities? Yes.    Do you have any questions about your discharge instructions: Diet   No. Medications  No. Follow up visit  No.  Do you have questions or concerns about your Care? No.  Actions: * If pain score is 4 or above: No action needed, pain <4.

## 2016-05-04 NOTE — Telephone Encounter (Signed)
  Follow up Call-  Call back number 05/03/2016  Post procedure Call Back phone  # [phone]  Permission to leave phone message Yes  Some recent data might be hidden    Patient was called for follow up after her procedure on 05/03/2016. No answer at the number given for follow up phone call. A message was left on the answering machine.

## 2016-05-09 ENCOUNTER — Encounter: Payer: Self-pay | Admitting: Gastroenterology

## 2016-05-26 ENCOUNTER — Other Ambulatory Visit: Payer: Self-pay | Admitting: *Deleted

## 2016-05-26 MED ORDER — DULAGLUTIDE 0.75 MG/0.5ML ~~LOC~~ SOAJ: SUBCUTANEOUS | 5 refills | Status: DC

## 2016-05-26 MED FILL — TRULICITY 0.75 MG/0.5 ML PE: 0.75 | 28 days supply | Qty: 2 | Fill #0

## 2016-05-26 NOTE — Telephone Encounter (Signed)
Patein is due for an A1c. Please ask her to come for a visit to evaluate her diabetes and consideration of medication adjustment

## 2016-05-29 NOTE — Telephone Encounter (Signed)
Patient is scheduled for 06/05/16 at 0830. Jazmin Hartsell,CMA

## 2016-06-05 ENCOUNTER — Encounter: Payer: Self-pay | Admitting: Student

## 2016-06-05 ENCOUNTER — Ambulatory Visit (INDEPENDENT_AMBULATORY_CARE_PROVIDER_SITE_OTHER): Payer: 59 | Admitting: Student

## 2016-06-05 ENCOUNTER — Other Ambulatory Visit (HOSPITAL_COMMUNITY)
Admission: RE | Admit: 2016-06-05 | Discharge: 2016-06-05 | Disposition: A | Discharge status: Home / Self Care | Payer: 59 | Source: Ambulatory Visit | Attending: Family Medicine | Admitting: Family Medicine

## 2016-06-05 VITALS — BP 150/99 | HR 80 | Temp 98.1°F | Wt 303.0 lb

## 2016-06-05 DIAGNOSIS — Z1151 Encounter for screening for human papillomavirus (HPV): Secondary | ICD-10-CM | POA: Diagnosis not present

## 2016-06-05 DIAGNOSIS — Z01419 Encounter for gynecological examination (general) (routine) without abnormal findings: Secondary | ICD-10-CM | POA: Insufficient documentation

## 2016-06-05 DIAGNOSIS — Z0000 Encounter for general adult medical examination without abnormal findings: Secondary | ICD-10-CM

## 2016-06-05 DIAGNOSIS — E119 Type 2 diabetes mellitus without complications: Secondary | ICD-10-CM

## 2016-06-05 DIAGNOSIS — Z124 Encounter for screening for malignant neoplasm of cervix: Secondary | ICD-10-CM | POA: Diagnosis not present

## 2016-06-05 DIAGNOSIS — I10 Essential (primary) hypertension: Secondary | ICD-10-CM

## 2016-06-05 DIAGNOSIS — Z01411 Encounter for gynecological examination (general) (routine) with abnormal findings: Secondary | ICD-10-CM | POA: Diagnosis not present

## 2016-06-05 LAB — POCT GLYCOSYLATED HEMOGLOBIN (HGB A1C): Hemoglobin A1C: 7.8

## 2016-06-05 MED ORDER — ASPIRIN EC 81 MG PO TBEC: 81.0000 mg | DELAYED_RELEASE_TABLET | Freq: Every day | ORAL | 2 refills | Status: AC

## 2016-06-05 MED ORDER — ATORVASTATIN CALCIUM 20 MG PO TABS: 20.0000 mg | ORAL_TABLET | Freq: Every day | ORAL | 3 refills | Status: DC

## 2016-06-05 NOTE — Assessment & Plan Note (Signed)
A1c improved to 7.8 from [id number] with trulicity. Will continue to follow closely and increase meds as needed - needs optho exam, patient counseled - follow in 3 months

## 2016-06-05 NOTE — Assessment & Plan Note (Signed)
Pap today, had flu shot on 9/22

## 2016-06-05 NOTE — Assessment & Plan Note (Signed)
Discussed exercise and modification - will follow

## 2016-06-05 NOTE — Assessment & Plan Note (Signed)
Blood pressure above goal at 150/99. If elevated at next visit, will up-titrate as needed

## 2016-06-05 NOTE — Progress Notes (Signed)
   Subjective:    Patient ID: Tara Campos, female    DOB: 1965/09/23, 50 y.o.   MRN: [id number]   CC: DM check  HPI: 50 y/o with a PMH of DM presents for DM check  DM - home CBGs have been approx 113 - denies hypoglycemia - has not yet had a flu shot this year' - tries to get exercise/eat healthfully  Hypertension - reports compliance with lisinopril - denies chest pain, SOB, headache  Gyn - due for a pap - denies vaginal irritation, discharge, bleeding - LMP 2014 prior to an endometrial ablation  Smoking status reviewed  Review of Systems  Per HPI, else denies recent illness, fever, abdominal pain, N/V/D, weakness    Objective:  BP (!) 150/99   Pulse 80   Temp 98.1 F (36.7 C) (Oral)   Wt (!) 303 lb (137.4 kg)   SpO2 100%   BMI 47.46 kg/m  Vitals and nursing note reviewed  General: NAD Cardiac: RRR Respiratory: CTAB, normal effort Extremities: no edema or cyanosis. WWP. Skin: warm and dry, no rashes noted Neuro: alert and oriented Pelvic: Normal EGBUS, normal vaginal, normal cervix with no CMT, normal mobile uterus, normal adnexa with no masses, no adnexal tenderness. Exam limited by body habitus    Assessment & Plan:    HYPERTENSION, BENIGN SYSTEMIC Blood pressure above goal at 150/99. If elevated at next visit, will up-titrate as needed  OBESITY, NOS Discussed exercise and modification - will follow  Diabetes mellitus without complication 123456 improved to 7.8 from [id number] with trulicity. Will continue to follow closely and increase meds as needed - needs optho exam, patient counseled - follow in 3 months  Health care maintenance Pap today, had flu shot on 9/22    Tara Campos A. Lincoln Brigham MD, [facility] Resident PGY-3 Pager [phone]

## 2016-06-05 NOTE — Patient Instructions (Signed)
Follow up in 3 months for DM Congrats! Your A1c is 7.8 Get 30 mins of exercise each day Obtain twice as many veg's as protein or carbohydrate foods for both lunch and dinner. If you have any questions or concerns, call the office at [phone]

## 2016-06-07 LAB — CYTOLOGY - PAP
Diagnosis: NEGATIVE
HPV (WINDOPATH): NOT DETECTED

## 2016-06-08 ENCOUNTER — Encounter: Payer: Self-pay | Admitting: Student

## 2016-06-15 MED FILL — ATORVASTATIN 20 MG TABLET: 20 | 90 days supply | Qty: 90 | Fill #0

## 2016-06-15 MED FILL — ASPIR-LOW EC 81 MG TABLET: 81 | 90 days supply | Qty: 90 | Fill #0

## 2016-07-10 MED FILL — metFORMIN HCL 500 MG TABS: 500 | 90 days supply | Qty: 180 | Fill #1

## 2016-07-14 MED FILL — TRULICITY 0.75 MG/0.5 ML PE: 0.75 | 28 days supply | Qty: 2 | Fill #1

## 2016-08-11 MED FILL — LISINOPRIL 10 MG TABLET: 10 | 60 days supply | Qty: 60 | Fill #2

## 2016-09-08 MED FILL — TRULICITY 0.75 MG/0.5 ML PE: 0.75 | 28 days supply | Qty: 2 | Fill #2

## 2016-10-11 ENCOUNTER — Other Ambulatory Visit: Payer: Self-pay | Admitting: Student

## 2016-10-11 MED FILL — TRULICITY 0.75 MG/0.5 ML PE: 0.75 | 28 days supply | Qty: 2 | Fill #3

## 2016-10-11 MED FILL — ATORVASTATIN 20 MG TABLET: 20 | 90 days supply | Qty: 90 | Fill #1

## 2016-10-11 MED FILL — metFORMIN HCL 500 MG TABS: 500 | 90 days supply | Qty: 180 | Fill #2

## 2016-10-12 MED FILL — LISINOPRIL 10 MG TABLET: 10 | 60 days supply | Qty: 60 | Fill #0

## 2016-12-08 MED FILL — TRULICITY 0.75 MG/0.5 ML PE: 0.75 | 28 days supply | Qty: 2 | Fill #4 | Status: TO

## 2017-01-12 DIAGNOSIS — H40023 Open angle with borderline findings, high risk, bilateral: Secondary | ICD-10-CM | POA: Diagnosis not present

## 2017-01-12 DIAGNOSIS — E119 Type 2 diabetes mellitus without complications: Secondary | ICD-10-CM | POA: Diagnosis not present

## 2017-01-12 DIAGNOSIS — H52223 Regular astigmatism, bilateral: Secondary | ICD-10-CM | POA: Diagnosis not present

## 2017-01-12 DIAGNOSIS — H5213 Myopia, bilateral: Secondary | ICD-10-CM | POA: Diagnosis not present

## 2017-01-12 DIAGNOSIS — H524 Presbyopia: Secondary | ICD-10-CM | POA: Diagnosis not present

## 2017-01-12 LAB — HM DIABETES EYE EXAM

## 2017-01-16 ENCOUNTER — Other Ambulatory Visit: Payer: Self-pay | Admitting: *Deleted

## 2017-01-16 MED FILL — SUMATRIPTAN SUCC 50 MG TAB: 50 | 6 days supply | Qty: 2 | Fill #2

## 2017-01-16 MED FILL — LISINOPRIL 10 MG TABLET: 10 | 60 days supply | Qty: 60 | Fill #1

## 2017-01-16 MED FILL — TRULICITY 0.75 MG/0.5 ML PE: 0.75 | 28 days supply | Qty: 2 | Fill #0

## 2017-01-16 MED FILL — ATORVASTATIN 20 MG TABLET: 20 | 90 days supply | Qty: 90 | Fill #2

## 2017-01-17 MED ORDER — SUMATRIPTAN SUCCINATE 50 MG PO TABS: ORAL_TABLET | ORAL | 1 refills | Status: DC

## 2017-01-17 MED ORDER — DULAGLUTIDE 0.75 MG/0.5ML ~~LOC~~ SOAJ: SUBCUTANEOUS | 5 refills | Status: DC

## 2017-03-01 MED FILL — SUMATRIPTAN SUCC 50 MG TAB: 50 | 30 days supply | Qty: 10 | Fill #0

## 2017-03-22 MED FILL — LISINOPRIL 10 MG TABLET: 10 | 60 days supply | Qty: 60 | Fill #2

## 2017-03-22 MED FILL — TRULICITY 0.75 MG/0.5 ML PE: 0.75 | 28 days supply | Qty: 2 | Fill #0

## 2017-03-27 ENCOUNTER — Other Ambulatory Visit: Payer: Self-pay | Admitting: *Deleted

## 2017-03-28 MED ORDER — METFORMIN HCL 500 MG PO TABS: 500.0000 mg | ORAL_TABLET | Freq: Two times a day (BID) | ORAL | 0 refills | Status: DC

## 2017-03-28 MED FILL — metFORMIN HCL 500 MG TABS: 500 | 60 days supply | Qty: 120 | Fill #0

## 2017-03-28 NOTE — Telephone Encounter (Signed)
Pt contacted and an apt has been made with PCP, 8/31.

## 2017-03-28 NOTE — Telephone Encounter (Signed)
Patient has not been seen in almost a year. She needs follow up on her Diabetes. I will refill a few months of medication, but I would like for her to schedule an appointment.  Thanks!

## 2017-03-30 MED FILL — CHLORHEXIDINE 0.12% RINSE: 0.12 | 16 days supply | Qty: 473 | Fill #0

## 2017-03-30 MED FILL — NAPROXEN SODIUM 550 MG TAB: 550 | 6 days supply | Qty: 12 | Fill #0

## 2017-03-30 MED FILL — PREVIDENT 5000 BOOSTER PLUS: 1.1 | 30 days supply | Qty: 100 | Fill #0

## 2017-03-30 MED FILL — AMOXICILLIN 500 MG CAPSULE: 500 | 7 days supply | Qty: 21 | Fill #0

## 2017-04-13 ENCOUNTER — Encounter: Payer: Self-pay | Admitting: Family Medicine

## 2017-04-13 ENCOUNTER — Ambulatory Visit (INDEPENDENT_AMBULATORY_CARE_PROVIDER_SITE_OTHER): Payer: 59 | Admitting: Family Medicine

## 2017-04-13 VITALS — BP 142/100 | HR 89 | Temp 98.5°F | Ht 67.0 in | Wt 299.0 lb

## 2017-04-13 DIAGNOSIS — E119 Type 2 diabetes mellitus without complications: Secondary | ICD-10-CM

## 2017-04-13 DIAGNOSIS — I10 Essential (primary) hypertension: Secondary | ICD-10-CM | POA: Diagnosis not present

## 2017-04-13 LAB — POCT GLYCOSYLATED HEMOGLOBIN (HGB A1C): Hemoglobin A1C: 10.4

## 2017-04-13 MED ORDER — LISINOPRIL 20 MG PO TABS: 20.0000 mg | ORAL_TABLET | Freq: Every day | ORAL | 3 refills | Status: DC

## 2017-04-13 MED ORDER — METFORMIN HCL 1000 MG PO TABS: 1000.0000 mg | ORAL_TABLET | Freq: Two times a day (BID) | ORAL | 3 refills | Status: DC

## 2017-04-13 MED FILL — LISINOPRIL 20 MG TAB: 20 | 90 days supply | Qty: 90 | Fill #0

## 2017-04-13 MED FILL — metFORMIN HCL 1000 MG TABS: 1000 | 90 days supply | Qty: 180 | Fill #0

## 2017-04-13 NOTE — Assessment & Plan Note (Signed)
HgbA1c 10.4 today, patient reports fasting glucose of 200's.  Patient to increase her metformin to 100 mg BID. She has previously had issues with taking it involving GI discomfort, but if this occurs, she will contact me and I will get her the extended release.  Patient to record her blood sugars until her next visit for 6 weeks.  Obtained Microalbumin/ Cr ratio and BMP. Foot exam performed with normal findings.

## 2017-04-13 NOTE — Assessment & Plan Note (Signed)
Patient reports high blood pressures on home checks. I will be increasing her Lisinopril to 20 mg daily. Will continue to increase if needed after return visit in 6 weeks.

## 2017-04-13 NOTE — Patient Instructions (Addendum)
Thank you for coming to see me today. It was a pleasure! Today we talked about:   Diabetes management: You should increase your metformin to 1000 mg twice per day with meals. Please do not hesitate to call if you are experiencing increasing GI upset. I can change the medication to help with that. Please return in 6 weeks to see how this is working and record your sugars every morning.  High Blood Pressure: I have increased your lisinopril to 20 mg daily. We will recheck your pressure when you return in 6 weeks.   Please follow-up with our clinic in 6 weeks.  If you have any questions or concerns, please do not hesitate to call the office at [phone].  Take Care,   [place] Ondria Oswald, DO

## 2017-04-13 NOTE — Progress Notes (Signed)
    Subjective:    Patient ID: Tara Campos, female    DOB: [birth year], 51 y.o.   MRN: [id number]   CC: Medication refill/ check up  HPI: Patient with no complaints today other than needing refills and concerns regarding her blood pressure and diabetes.   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  - Discussed packing her own lunch because what is provided is not as healthy.  - Patient reports eye appointment in May 2018.   Hypertension - reports compliance with lisinopril 10 mg, however reports that her pressures have been elevated lately. 142/100 today in the office.  - denies chest pain, SOB, headache, vision changes  Smoking status reviewed  Review of Systems  Per HPI, else denies recent illness, fever, headache, changes in vision, chest pain, shortness of breath, abdominal pain, N/V/D, weakness   Patient Active Problem List   Diagnosis Date Noted  . Health care maintenance 01/07/2016  . Diabetes mellitus without complication ([place]) 11/94/1740  . Migraine headache 04/12/2012  . Anemia 09/28/2011  . OBESITY, NOS 10/11/2006  . HYPERTENSION, BENIGN SYSTEMIC 10/11/2006     Objective:  BP (!) 142/100   Pulse 89   Temp 98.5 F (36.9 C) (Oral)   Ht 5\' 7"  (1.702 m)   Wt 299 lb (135.6 kg)   SpO2 99%   BMI 46.83 kg/m  Vitals and nursing note reviewed  General: NAD, obese Cardiac: RRR, normal heart sounds, no murmurs. 2+ radial and PT pulses bilaterally Respiratory: CTAB, normal effort Abdomen: soft, nontender, nondistended, no hepatic or splenomegaly. Bowel sounds present Extremities: no edema or cyanosis. WWP. Skin: warm and dry, no rashes noted Neuro: alert and oriented, no focal deficits  Diabetic Foot Exam - Simple   Simple  Foot Form Diabetic Foot exam was performed with the following findings:  Yes 04/13/2017  4:45 PM  Visual Inspection No deformities, no ulcerations, no other skin breakdown bilaterally:  Yes Sensation Testing Intact to touch and monofilament testing bilaterally:  Yes Pulse Check Posterior Tibialis and Dorsalis pulse intact bilaterally:  Yes Comments    Assessment & Plan:   Diabetes mellitus without complication CXKG8J 85.6 today, patient reports fasting glucose of 200's.  Patient to increase her metformin to 100 mg BID. She has previously had issues with taking it involving GI discomfort, but if this occurs, she will contact me and I will get her the extended release.  Patient to record her blood sugars until her next visit for 6 weeks.  Obtained Microalbumin/ Cr ratio and BMP. Foot exam performed with normal findings.   HYPERTENSION, BENIGN SYSTEMIC Patient reports high blood pressures on home checks. I will be increasing her Lisinopril to 20 mg daily. Will continue to increase if needed after return visit in 6 weeks.     [place] Mattisyn Cardona, DO Family Medicine Resident PGY-1

## 2017-04-14 LAB — BASIC METABOLIC PANEL
BUN/Creatinine Ratio: 10 (ref 9–23)
BUN: 8 mg/dL (ref 6–24)
CALCIUM: 9.6 mg/dL (ref 8.7–10.2)
CO2: 25 mmol/L (ref 20–29)
CREATININE: 0.8 mg/dL (ref 0.57–1.00)
Chloride: 99 mmol/L (ref 96–106)
GFR calc Af Amer: 99 mL/min/{1.73_m2} (ref >59)
GFR, EST NON AFRICAN AMERICAN: 86 mL/min/{1.73_m2} (ref >59)
Glucose: 226 mg/dL — ABNORMAL HIGH (ref 65–99)
Potassium: 3.7 mmol/L (ref 3.5–5.2)
SODIUM: 140 mmol/L (ref 134–144)

## 2017-04-14 LAB — MICROALBUMIN / CREATININE URINE RATIO
Creatinine, Urine: 141.5 mg/dL
Microalb/Creat Ratio: 323.7 mg/g creat — ABNORMAL HIGH (ref 0.0–30.0)
Microalbumin, Urine: 458 ug/mL

## 2017-04-19 ENCOUNTER — Telehealth: Payer: Self-pay | Admitting: Family Medicine

## 2017-04-19 NOTE — Telephone Encounter (Signed)
Called patient to inform her of her new lab results, but had to leave a message. Encouraged her to follow up in 6 weeks as discussed to go over her labs, as nothing was acute.

## 2017-09-07 MED FILL — LISINOPRIL 20 MG TABLET: 20 | 90 days supply | Qty: 90 | Fill #1 | Status: TO

## 2017-09-07 MED FILL — metFORMIN HCL 1000 MG TABS: 1000 | 90 days supply | Qty: 180 | Fill #1 | Status: TO

## 2017-09-13 ENCOUNTER — Encounter: Payer: Self-pay | Admitting: Family Medicine

## 2017-12-21 MED FILL — SUMATRIPTAN SUCC 50 MG TABL: 50 | 30 days supply | Qty: 10 | Fill #1

## 2022-01-17 ENCOUNTER — Encounter: Payer: Self-pay | Admitting: *Deleted

## 2023-05-15 DEATH — deceased
